# Patient Record
Sex: Female | Born: 1965 | Race: Black or African American | Hispanic: No | Marital: Married | Smoking: Never smoker
Health system: Southern US, Community
[De-identification: ages and names within clinical notes are randomized; demographics above are authoritative.]

## PROBLEM LIST (undated history)

## (undated) DIAGNOSIS — E119 Type 2 diabetes mellitus without complications: Secondary | ICD-10-CM

## (undated) DIAGNOSIS — D649 Anemia, unspecified: Secondary | ICD-10-CM

## (undated) DIAGNOSIS — E669 Obesity, unspecified: Secondary | ICD-10-CM

## (undated) DIAGNOSIS — E1169 Type 2 diabetes mellitus with other specified complication: Secondary | ICD-10-CM

## (undated) DIAGNOSIS — I10 Essential (primary) hypertension: Secondary | ICD-10-CM

## (undated) HISTORY — DX: Essential (primary) hypertension: I10

## (undated) HISTORY — DX: Obesity, unspecified: E66.9

## (undated) HISTORY — DX: Type 2 diabetes mellitus with other specified complication: E11.69

## (undated) HISTORY — PX: WISDOM TOOTH EXTRACTION: SHX21

## (undated) HISTORY — DX: Anemia, unspecified: D64.9

## (undated) HISTORY — PX: COLONOSCOPY: SHX174

---
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
## 1998-09-15 HISTORY — PX: MYOMECTOMY: SHX85

## 2019-04-06 ENCOUNTER — Encounter: Payer: Self-pay | Admitting: Obstetrics & Gynecology

## 2019-04-06 ENCOUNTER — Other Ambulatory Visit: Payer: Self-pay

## 2019-04-06 ENCOUNTER — Ambulatory Visit (INDEPENDENT_AMBULATORY_CARE_PROVIDER_SITE_OTHER): Payer: Managed Care, Other (non HMO) | Admitting: Obstetrics & Gynecology

## 2019-04-06 VITALS — BP 132/74 | HR 98 | Ht 64.0 in | Wt 245.0 lb

## 2019-04-06 DIAGNOSIS — Z124 Encounter for screening for malignant neoplasm of cervix: Secondary | ICD-10-CM | POA: Diagnosis not present

## 2019-04-06 DIAGNOSIS — Z1151 Encounter for screening for human papillomavirus (HPV): Secondary | ICD-10-CM | POA: Diagnosis not present

## 2019-04-06 DIAGNOSIS — Z01419 Encounter for gynecological examination (general) (routine) without abnormal findings: Secondary | ICD-10-CM

## 2019-04-06 NOTE — Progress Notes (Signed)
Last periods March 2019, Sept 2019. Kathrene Alu RN

## 2019-04-06 NOTE — Progress Notes (Signed)
Subjective:     Kiara Hughes is a 53 y.o. female here for a routine exam.  G0 LMP Sept, 2019. Last cycles prior to  Sept was Mar 2019.  Current complaints: none. Pt recently relocated from [place], [place]. Referred her by Dr. Frederik Schmidt.  She is working in Press photographer at [employer]. She is married and sexually active.   Last colonoscopy was 1 yer prev. Had a precancerous lesion. Was told that she needed a repeat colonoscopy in 1 year.        Gynecologic History No LMP recorded. Patient is perimenopausal. Contraception: perimenopuase.  [place] 1 year prev [place]. Results were: normal Last mammogram: 1 year prev in [place]. / Results were: normal  Obstetric History OB History  Gravida Para Term Preterm AB Living  0 0 0 0 0 0  SAB TAB Ectopic Multiple Live Births  0 0 0 0 0   The following portions of the patient's history were reviewed and updated as appropriate: allergies, current medications, past family history, past medical history, past social history, past surgical history and problem list.  Review of Systems Pertinent items are noted in HPI.    Objective:  BP 132/74   Pulse 98   Ht 5\' 4"  (1.626 m)   Wt 245 lb (111.1 kg)   BMI 42.05 kg/m   General Appearance:    Alert, cooperative, no distress, appears stated age  Head:    Normocephalic, without obvious abnormality, atraumatic  Eyes:    conjunctiva/corneas clear, EOM's intact, both eyes  Ears:    Normal external ear canals, both ears  Nose:   Nares normal, septum midline, mucosa normal, no drainage    or sinus tenderness  Throat:   Lips, mucosa, and tongue normal; teeth and gums normal  Neck:   Supple, symmetrical, trachea midline, no adenopathy;    thyroid:  no enlargement/tenderness/nodules  Back:     Symmetric, no curvature, ROM normal, no CVA tenderness  Lungs:     respirations unlabored  Chest Wall:    No tenderness or deformity   Heart:    Regular rate and rhythm  Breast Exam:    No tenderness, masses, or nipple  abnormality  Abdomen:     Soft, non-tender, bowel sounds active all four quadrants,    no masses, no organomegaly  Genitalia:    Normal female without lesion, discharge or tenderness     Extremities:   Extremities normal, atraumatic, no cyanosis or edema  Pulses:   2+ and symmetric all extremities  Skin:   Skin color, texture, turgor normal, no rashes or lesions    Assessment:    Healthy female exam.   Perimenopausal state Needs primary care provider Needs GI referral to eval need for repeat colonoscopy after precancerous lesion noted Plan:   Screening mammogram  Referral to GI Referral to primary care F/u in 1 year or sooner prn Records from [facility]. Harraway-Smith, M.D., Cherlynn June

## 2019-04-08 LAB — CYTOLOGY - PAP
Adequacy: ABSENT
Diagnosis: NEGATIVE
HPV: NOT DETECTED

## 2019-04-25 ENCOUNTER — Telehealth: Payer: Self-pay | Admitting: Gastroenterology

## 2019-04-25 NOTE — Telephone Encounter (Signed)
I have not seen these records on my desk today. Thanks.

## 2019-04-25 NOTE — Telephone Encounter (Signed)
Hi Dr. Tarri Glenn, we have received a referral from Patient's Obgyn for a repeat colon. Patient had a colon in April of last year that was abnormal per notes in the referral. We have received her previous GI records and they will be placed on your desk for review. Patient is requesting a female physician. Thank you.

## 2019-05-06 NOTE — Telephone Encounter (Signed)
Spoke to Release of Information at previous GI practice. Received a message that our physician Dr Tarri Glenn reviewed previous Colonoscopy & pathology and is now looking for surveillance recommendations. They are unsure of what exactly to send and wonders if we can call back and clarify please? Return call to [phone] choose option 1 twice.

## 2019-05-16 NOTE — Telephone Encounter (Signed)
Hi Dr. Tarri Glenn, I spoke with release of information regarding surveillance recommendations for pt, They do not have that information, they stated that pt was seen 2 more times there for office visits, they faxed those records for your review but that is all they have. Records will be placed on your desk for review when you return to the office. Thank you.

## 2019-05-17 NOTE — Telephone Encounter (Signed)
I reviewed all of the records provided. Thank you for your hard work obtaining all of the results.  It appears that 6 polyps were removed. They were both sessile serrated polyps and a hyperplastic polyp. Given these results, I recommend surveillance in 3 years from the time of her last colonoscopy. Surveillance colonoscopy due 12/2020.  Thank you.

## 2019-05-18 NOTE — Telephone Encounter (Signed)
Pt's records will be filed in Records cabinet.

## 2019-05-18 NOTE — Telephone Encounter (Signed)
Informed pt of her recall date 12/2020 and that she will receive a letter when she is due.

## 2019-05-25 ENCOUNTER — Ambulatory Visit: Payer: Managed Care, Other (non HMO) | Admitting: Family Medicine

## 2019-06-01 ENCOUNTER — Ambulatory Visit: Payer: Managed Care, Other (non HMO) | Admitting: Family Medicine

## 2019-06-02 ENCOUNTER — Other Ambulatory Visit: Payer: Self-pay

## 2019-06-03 ENCOUNTER — Encounter: Payer: Self-pay | Admitting: Family Medicine

## 2019-06-03 ENCOUNTER — Ambulatory Visit: Payer: Managed Care, Other (non HMO) | Admitting: Family Medicine

## 2019-06-03 ENCOUNTER — Other Ambulatory Visit: Payer: Self-pay

## 2019-06-03 VITALS — BP 138/76 | HR 106 | Temp 97.1°F | Ht 64.0 in | Wt 247.5 lb

## 2019-06-03 DIAGNOSIS — I10 Essential (primary) hypertension: Secondary | ICD-10-CM

## 2019-06-03 DIAGNOSIS — Z23 Encounter for immunization: Secondary | ICD-10-CM

## 2019-06-03 DIAGNOSIS — E119 Type 2 diabetes mellitus without complications: Secondary | ICD-10-CM | POA: Diagnosis not present

## 2019-06-03 LAB — LIPID PANEL
Cholesterol: 164 mg/dL (ref 0–200)
HDL: 42.2 mg/dL (ref >39.00)
LDL Cholesterol: 98 mg/dL (ref 0–99)
NonHDL: 121.96
Total CHOL/HDL Ratio: 4
Triglycerides: 119 mg/dL (ref 0.0–149.0)
VLDL: 23.8 mg/dL (ref 0.0–40.0)

## 2019-06-03 LAB — COMPREHENSIVE METABOLIC PANEL
ALT: 20 U/L (ref 0–35)
AST: 14 U/L (ref 0–37)
Albumin: 4.3 g/dL (ref 3.5–5.2)
Alkaline Phosphatase: 75 U/L (ref 39–117)
BUN: 13 mg/dL (ref 6–23)
CO2: 26 mEq/L (ref 19–32)
Calcium: 9.3 mg/dL (ref 8.4–10.5)
Chloride: 101 mEq/L (ref 96–112)
Creatinine, Ser: 0.71 mg/dL (ref 0.40–1.20)
GFR: 104.23 mL/min (ref >60.00)
Glucose, Bld: 178 mg/dL — ABNORMAL HIGH (ref 70–99)
Potassium: 4.1 mEq/L (ref 3.5–5.1)
Sodium: 136 mEq/L (ref 135–145)
Total Bilirubin: 0.2 mg/dL (ref 0.2–1.2)
Total Protein: 7.8 g/dL (ref 6.0–8.3)

## 2019-06-03 LAB — HEMOGLOBIN A1C: Hgb A1c MFr Bld: 6.9 % — ABNORMAL HIGH (ref 4.6–6.5)

## 2019-06-03 MED ORDER — AMLODIPINE BESYLATE 10 MG PO TABS: 10.0000 mg | ORAL_TABLET | Freq: Every day | ORAL | 2 refills | Status: DC

## 2019-06-03 MED ORDER — LOSARTAN POTASSIUM 100 MG PO TABS: 100.0000 mg | ORAL_TABLET | Freq: Every day | ORAL | 2 refills | Status: DC

## 2019-06-03 MED ORDER — METFORMIN HCL ER 500 MG PO TB24: 500.0000 mg | ORAL_TABLET | Freq: Two times a day (BID) | ORAL | 2 refills | Status: DC

## 2019-06-03 MED ORDER — ATORVASTATIN CALCIUM 40 MG PO TABS: 40.0000 mg | ORAL_TABLET | Freq: Every day | ORAL | 3 refills | Status: DC

## 2019-06-03 MED ORDER — SITAGLIPTIN PHOSPHATE 100 MG PO TABS: 100.0000 mg | ORAL_TABLET | Freq: Every day | ORAL | 2 refills | Status: DC

## 2019-06-03 NOTE — Progress Notes (Signed)
Chief Complaint  Patient presents with  . New Patient (Initial Visit)       New Patient Visit SUBJECTIVE: HPI: Kiara Hughes is an 53 y.o.female who is being seen for establishing care.  The patient was previously seen in [place].  Patient has a history of diabetes.  She is currently on metformin XR 500 mg twice daily and glipizide 5 mg twice daily as needed.  Her last A1c was 7.5.  She has lost 40 pounds since initially being diagnosed.  She is no longer on insulin.  She is not on a statin and has never had a pneumonia shot as an adult.  Diet is improving but still has areas to get better.  She is not doing much exercise right now.  She checks her sugars 2-3 times a day.  Morning sugars are usually low to mid 100s.  Hypertension Patient presents for hypertension follow up. She does monitor home blood pressures. She is compliant with medications-losartan 100 mg daily, amlodipine 10 mg daily. Patient has these side effects of medication: none She is sometimes adhering to a healthy diet overall. Exercise: none   Allergies  Allergen Reactions  . Penicillins Nausea And Vomiting  . Sulfa Antibiotics Rash    Past Medical History:  Diagnosis Date  . Diabetes mellitus without complication ([place])   . Hypertension    History reviewed. No pertinent surgical history.   Family History  Problem Relation Age of Onset  . Diabetes Mother   . Hypertension Mother   . Diabetes Sister   . Cancer Neg Hx   . Stroke Neg Hx    Allergies  Allergen Reactions  . Penicillins Nausea And Vomiting  . Sulfa Antibiotics Rash    Current Outpatient Medications:  .  amLODipine (NORVASC) 10 MG tablet, Take 1 tablet (10 mg total) by mouth daily., Disp: 90 tablet, Rfl: 2 .  glucose blood test strip, , Disp: , Rfl:  .  Lancets (ONETOUCH DELICA PLUS 123XX123) MISC, , Disp: , Rfl:  .  losartan (COZAAR) 100 MG tablet, Take 1 tablet (100 mg total) by mouth daily., Disp: 90 tablet, Rfl: 2 .  metFORMIN  (GLUCOPHAGE-XR) 500 MG 24 hr tablet, Take 1 tablet (500 mg total) by mouth 2 (two) times daily., Disp: 180 tablet, Rfl: 2 .  atorvastatin (LIPITOR) 40 MG tablet, Take 1 tablet (40 mg total) by mouth daily., Disp: 90 tablet, Rfl: 3 .  sitaGLIPtin (JANUVIA) 100 MG tablet, Take 1 tablet (100 mg total) by mouth daily., Disp: 90 tablet, Rfl: 2  No LMP recorded. Patient is perimenopausal.  ROS Cardiovascular: Denies chest pain  Respiratory: Denies dyspnea   OBJECTIVE: BP 138/76 (BP Location: Left Arm, Patient Position: Sitting, Cuff Size: Large)   Pulse (!) 106   Temp (!) 97.1 F (36.2 C) (Temporal)   Ht 5\' 4"  (1.626 m)   Wt 247 lb 8 oz (112.3 kg)   SpO2 96%   BMI 42.48 kg/m   Constitutional: -  VS reviewed -  Well developed, well nourished, appears stated age -  No apparent distress  Psychiatric: -  Oriented to person, place, and time -  Memory intact -  Affect and mood normal -  Fluent conversation, good eye contact -  Judgment and insight age appropriate  Eye: -  Conjunctivae clear, no discharge -  Pupils symmetric, round, reactive to light  ENMT: -  MMM    Pharynx moist, no exudate, no erythema  Neck: -  No gross swelling, no palpable  masses -  Thyroid midline, not enlarged, mobile, no palpable masses  Cardiovascular: -  RRR -  No LE edema  Respiratory: -  Normal respiratory effort, no accessory muscle use, no retraction -  Breath sounds equal, no wheezes, no ronchi, no crackles  Musculoskeletal: -  No clubbing, no cyanosis -  Gait normal  Skin: -  No significant lesion on inspection -  Warm and dry to palpation   ASSESSMENT/PLAN: Type 2 diabetes mellitus without complication, without long-term current use of insulin (HCC) - Plan: metFORMIN (GLUCOPHAGE-XR) 500 MG 24 hr tablet, sitaGLIPtin (JANUVIA) 100 MG tablet, Comprehensive metabolic panel, Lipid panel, Hemoglobin A1c, atorvastatin (LIPITOR) 40 MG tablet  Essential hypertension - Plan: losartan (COZAAR) 100 MG tablet,  amLODipine (NORVASC) 10 MG tablet  Need for vaccination against Streptococcus pneumoniae - Plan: Pneumococcal polysaccharide vaccine 23-valent greater than or equal to 2yo subcutaneous/IM   1-add statin, stop glipizide given hypoglycemia, start Januvia.  We did discuss GLP-1's and SGLT2 inhibitors.  She prefers Januvia.  Counseled on diet and exercise. 2-continue medications.  Check as needed. Patient instructed to sign release of records form from her previous PCP. Patient should return in 3-6 mo pending her A1c. The patient voiced understanding and agreement to the plan.   [place], DO 06/03/19  8:29 AM

## 2019-06-03 NOTE — Patient Instructions (Addendum)
I recommend getting the flu shot in mid October. This suggestion would change if the CDC comes out with a different recommendation.   Give [place] 2-3 business days to get the results of your labs back.   Keep the diet clean and stay active.  Aim to do some physical exertion for 150 minutes per week. This is typically divided into 5 days per week, 30 minutes per day. The activity should be enough to get your heart rate up. Anything is better than nothing if you have time constraints.  Keep the diet clean and stay active.

## 2019-06-06 ENCOUNTER — Encounter: Payer: Self-pay | Admitting: Family Medicine

## 2019-06-07 ENCOUNTER — Other Ambulatory Visit: Payer: Self-pay | Admitting: Family Medicine

## 2019-06-07 MED ORDER — METFORMIN HCL 1000 MG PO TABS: 1000.0000 mg | ORAL_TABLET | Freq: Two times a day (BID) | ORAL | 1 refills | Status: DC

## 2019-06-20 ENCOUNTER — Other Ambulatory Visit: Payer: Self-pay | Admitting: Family Medicine

## 2019-06-20 MED ORDER — ONETOUCH DELICA PLUS LANCET33G MISC: 1.0000 | Freq: Every day | 3 refills | Status: DC

## 2019-06-20 MED ORDER — GLUCOSE BLOOD VI STRP: ORAL_STRIP | 3 refills | Status: DC

## 2019-06-27 ENCOUNTER — Encounter: Payer: Self-pay | Admitting: Family Medicine

## 2019-06-28 ENCOUNTER — Other Ambulatory Visit: Payer: Self-pay | Admitting: Family Medicine

## 2019-06-28 MED ORDER — ALCOHOL PADS 70 % PADS: MEDICATED_PAD | 1 refills | Status: DC

## 2019-07-12 ENCOUNTER — Encounter: Payer: Self-pay | Admitting: Family Medicine

## 2019-07-18 ENCOUNTER — Encounter: Payer: Self-pay | Admitting: Family Medicine

## 2019-07-21 ENCOUNTER — Encounter: Payer: Self-pay | Admitting: Hematology & Oncology

## 2019-07-21 LAB — HM DIABETES EYE EXAM

## 2019-07-26 ENCOUNTER — Encounter: Payer: Self-pay | Admitting: Family Medicine

## 2019-07-27 ENCOUNTER — Other Ambulatory Visit: Payer: Self-pay | Admitting: Family Medicine

## 2019-07-27 ENCOUNTER — Encounter: Payer: Self-pay | Admitting: Family Medicine

## 2019-07-27 MED ORDER — MICROLET LANCETS MISC: 1.0000 | Freq: Every day | 1 refills | Status: DC

## 2019-07-27 MED ORDER — BAYER MICROLET LANCETS MISC: 1 refills | Status: DC

## 2019-07-27 MED ORDER — CONTOUR NEXT MONITOR W/DEVICE KIT: 1.0000 | PACK | Freq: Every day | 0 refills | Status: DC

## 2019-07-27 MED ORDER — CONTOUR NEXT TEST VI STRP: ORAL_STRIP | 1 refills | Status: DC

## 2019-08-25 ENCOUNTER — Encounter: Payer: Self-pay | Admitting: Family Medicine

## 2019-08-29 ENCOUNTER — Encounter: Payer: Self-pay | Admitting: Family Medicine

## 2019-08-29 ENCOUNTER — Other Ambulatory Visit: Payer: Self-pay | Admitting: Family Medicine

## 2019-08-31 ENCOUNTER — Ambulatory Visit (HOSPITAL_BASED_OUTPATIENT_CLINIC_OR_DEPARTMENT_OTHER): Payer: Managed Care, Other (non HMO)

## 2019-09-01 ENCOUNTER — Ambulatory Visit (HOSPITAL_BASED_OUTPATIENT_CLINIC_OR_DEPARTMENT_OTHER)
Admission: RE | Admit: 2019-09-01 | Discharge: 2019-09-01 | Disposition: A | Discharge status: Home / Self Care | Payer: Managed Care, Other (non HMO) | Source: Ambulatory Visit | Attending: Obstetrics & Gynecology | Admitting: Obstetrics & Gynecology

## 2019-09-01 ENCOUNTER — Other Ambulatory Visit: Payer: Self-pay

## 2019-09-01 DIAGNOSIS — Z1231 Encounter for screening mammogram for malignant neoplasm of breast: Secondary | ICD-10-CM | POA: Diagnosis present

## 2019-09-01 DIAGNOSIS — Z01419 Encounter for gynecological examination (general) (routine) without abnormal findings: Secondary | ICD-10-CM

## 2019-09-05 ENCOUNTER — Ambulatory Visit (HOSPITAL_BASED_OUTPATIENT_CLINIC_OR_DEPARTMENT_OTHER): Payer: Managed Care, Other (non HMO)

## 2019-09-07 ENCOUNTER — Other Ambulatory Visit: Payer: Self-pay | Admitting: Obstetrics & Gynecology

## 2019-09-07 DIAGNOSIS — R928 Other abnormal and inconclusive findings on diagnostic imaging of breast: Secondary | ICD-10-CM

## 2019-09-27 ENCOUNTER — Ambulatory Visit
Admission: RE | Admit: 2019-09-27 | Discharge: 2019-09-27 | Disposition: A | Discharge status: Home / Self Care | Payer: Managed Care, Other (non HMO) | Source: Ambulatory Visit | Attending: Obstetrics & Gynecology | Admitting: Obstetrics & Gynecology

## 2019-09-27 ENCOUNTER — Ambulatory Visit: Payer: Managed Care, Other (non HMO)

## 2019-09-27 ENCOUNTER — Other Ambulatory Visit: Payer: Self-pay

## 2019-09-27 DIAGNOSIS — R928 Other abnormal and inconclusive findings on diagnostic imaging of breast: Secondary | ICD-10-CM

## 2019-10-01 ENCOUNTER — Encounter: Payer: Self-pay | Admitting: Family Medicine

## 2019-10-05 ENCOUNTER — Other Ambulatory Visit: Payer: Self-pay | Admitting: Family Medicine

## 2019-11-16 ENCOUNTER — Encounter: Payer: Self-pay | Admitting: Family Medicine

## 2019-11-17 ENCOUNTER — Ambulatory Visit: Payer: Managed Care, Other (non HMO) | Attending: Internal Medicine

## 2019-11-17 DIAGNOSIS — Z23 Encounter for immunization: Secondary | ICD-10-CM | POA: Insufficient documentation

## 2019-11-17 NOTE — Progress Notes (Signed)
   Covid-19 Vaccination Clinic  Name:  Kiara Hughes    MRN: [id number] DOB: August 06, 1966  11/17/2019  Kiara Hughes was observed post Covid-19 immunization for 15 minutes without incident. She was provided with Vaccine Information Sheet and instruction to access the V-Safe system.   Kiara Hughes was instructed to call 911 with any severe reactions post vaccine: Marland Kitchen Difficulty breathing  . Swelling of face and throat  . A fast heartbeat  . A bad rash all over body  . Dizziness and weakness   Immunizations Administered    Name Date Dose VIS Date Route   Moderna COVID-19 Vaccine 11/17/2019 11:27 AM 0.5 mL 08/16/2019 Intramuscular   Manufacturer: Moderna   Lot: [id number]   [id number]

## 2019-11-18 ENCOUNTER — Encounter: Payer: Self-pay | Admitting: Family Medicine

## 2019-11-29 ENCOUNTER — Other Ambulatory Visit: Payer: Self-pay | Admitting: Family Medicine

## 2019-11-29 DIAGNOSIS — E119 Type 2 diabetes mellitus without complications: Secondary | ICD-10-CM

## 2019-11-30 ENCOUNTER — Other Ambulatory Visit: Payer: Self-pay

## 2019-12-02 ENCOUNTER — Ambulatory Visit: Payer: Managed Care, Other (non HMO) | Admitting: Family Medicine

## 2019-12-02 ENCOUNTER — Other Ambulatory Visit: Payer: Self-pay

## 2019-12-02 ENCOUNTER — Encounter: Payer: Self-pay | Admitting: Family Medicine

## 2019-12-02 VITALS — BP 122/82 | HR 103 | Temp 96.5°F | Ht 64.0 in | Wt 244.2 lb

## 2019-12-02 DIAGNOSIS — I10 Essential (primary) hypertension: Secondary | ICD-10-CM

## 2019-12-02 DIAGNOSIS — E1169 Type 2 diabetes mellitus with other specified complication: Secondary | ICD-10-CM | POA: Diagnosis not present

## 2019-12-02 DIAGNOSIS — E669 Obesity, unspecified: Secondary | ICD-10-CM

## 2019-12-02 DIAGNOSIS — E1165 Type 2 diabetes mellitus with hyperglycemia: Secondary | ICD-10-CM | POA: Insufficient documentation

## 2019-12-02 DIAGNOSIS — E119 Type 2 diabetes mellitus without complications: Secondary | ICD-10-CM | POA: Insufficient documentation

## 2019-12-02 LAB — MICROALBUMIN / CREATININE URINE RATIO
Creatinine,U: 150.2 mg/dL
Microalb Creat Ratio: 1.4 mg/g (ref 0.0–30.0)
Microalb, Ur: 2.2 mg/dL — ABNORMAL HIGH (ref 0.0–1.9)

## 2019-12-02 LAB — HEMOGLOBIN A1C: Hgb A1c MFr Bld: 6.8 % — ABNORMAL HIGH (ref 4.6–6.5)

## 2019-12-02 NOTE — Patient Instructions (Signed)
Give us 2-3 business days to get the results of your labs back.   Keep the diet clean and stay active.  Check your sugars around 2-3 times per week. Alternate checking in the morning before you eat, in the afternoon and before bed. Write them down and bring it to your next appointment.   Let us know if you need anything. 

## 2019-12-02 NOTE — Progress Notes (Signed)
Subjective:   Chief Complaint  Patient presents with  . Follow-up    Kiara Hughes is a 54 y.o. female here for follow-up of diabetes.   Kiara Hughes's self monitored glucose range is 130-150's Patient denies hypoglycemic reactions. She checks her glucose levels 2 times per day. Patient does not require insulin.   Medications include: metformin 1000 mg bid, Januvia 100 mg/d Exercise: walking  Hypertension Patient presents for hypertension follow up. She does not routinely monitor home blood pressures. She is compliant with medications - losartan 100 mg/d, Norvasc 10 mg/d Patient has these side effects of medication: none Diet/exercise as above  Past Medical History:  Diagnosis Date  . Diabetes mellitus without complication ([place])   . Hypertension      Related testing: Date of retinal exam: Done Pneumovax: done Flu Shot: done  Review of Systems: Pulmonary:  No SOB Cardiovascular:  No chest pain  Objective:  BP 122/82 (BP Location: Left Arm, Patient Position: Sitting, Cuff Size: Large)   Pulse (!) 103   Temp (!) 96.5 F (35.8 C) (Temporal)   Ht 5\' 4"  (1.626 m)   Wt 244 lb 4 oz (110.8 kg)   SpO2 98%   BMI 41.93 kg/m  General:  Well developed, well nourished, in no apparent distress Skin:  Warm, no pallor or diaphoresis Head:  Normocephalic, atraumatic Eyes:  Pupils equal and round, sclera anicteric without injection  Lungs:  CTAB, no access msc use Cardio:  RRR, no bruits, no LE edema Musculoskeletal:  Symmetrical muscle groups noted without atrophy or deformity Neuro:  Sensation intact to pinprick on feet Psych: Age appropriate judgment and insight  Assessment:   Diabetes mellitus type 2 in obese (HCC) - Plan: Hemoglobin A1c, Microalbumin / creatinine urine ratio  Essential hypertension   Plan:   Orders as above. Counseled on diet and exercise. Cont current meds.  F/u in 6 mo pending above. The patient voiced understanding and agreement to the  plan.  [place], DO 12/02/19 9:51 AM

## 2019-12-07 ENCOUNTER — Encounter: Payer: Self-pay | Admitting: Family Medicine

## 2019-12-21 ENCOUNTER — Ambulatory Visit: Payer: Managed Care, Other (non HMO) | Attending: Internal Medicine

## 2019-12-21 DIAGNOSIS — Z23 Encounter for immunization: Secondary | ICD-10-CM

## 2019-12-21 NOTE — Progress Notes (Signed)
   Covid-19 Vaccination Clinic  Name:  Kiara Hughes    MRN: [id number] DOB: 1966/04/24  12/21/2019  [name] was observed post Covid-19 immunization for 15 minutes without incident. She was provided with Vaccine Information Sheet and instruction to access the V-Safe system.   [name] was instructed to call 911 with any severe reactions post vaccine: Marland Kitchen Difficulty breathing  . Swelling of face and throat  . A fast heartbeat  . A bad rash all over body  . Dizziness and weakness   Immunizations Administered    Name Date Dose VIS Date Route   Moderna COVID-19 Vaccine 12/21/2019 10:49 AM 0.5 mL 08/16/2019 Intramuscular   Manufacturer: Levan Hurst   [id number]   [id number]

## 2020-01-06 ENCOUNTER — Other Ambulatory Visit: Payer: Self-pay | Admitting: Family Medicine

## 2020-01-06 DIAGNOSIS — I10 Essential (primary) hypertension: Secondary | ICD-10-CM

## 2020-01-11 IMAGING — MG DIGITAL SCREENING BILAT W/ TOMO W/ CAD
8 series · 8 of 24 positions shown · non-contrast
Comparison: Previous exam(s).

CLINICAL DATA: Screening.

EXAM:
DIGITAL SCREENING BILATERAL MAMMOGRAM WITH TOMO AND CAD

[L CC synth-2D]
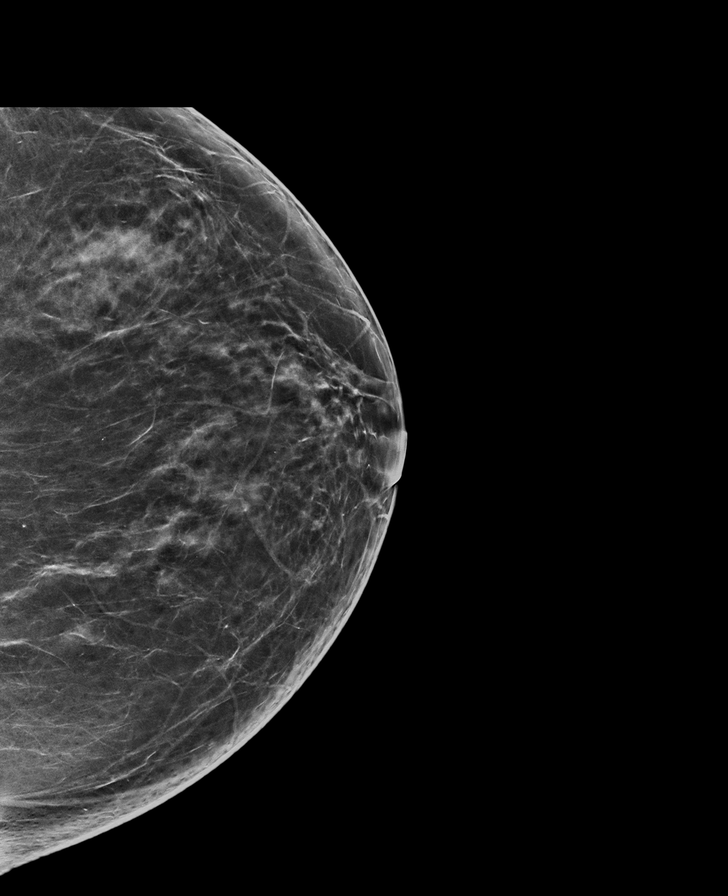

[R CC synth-2D]
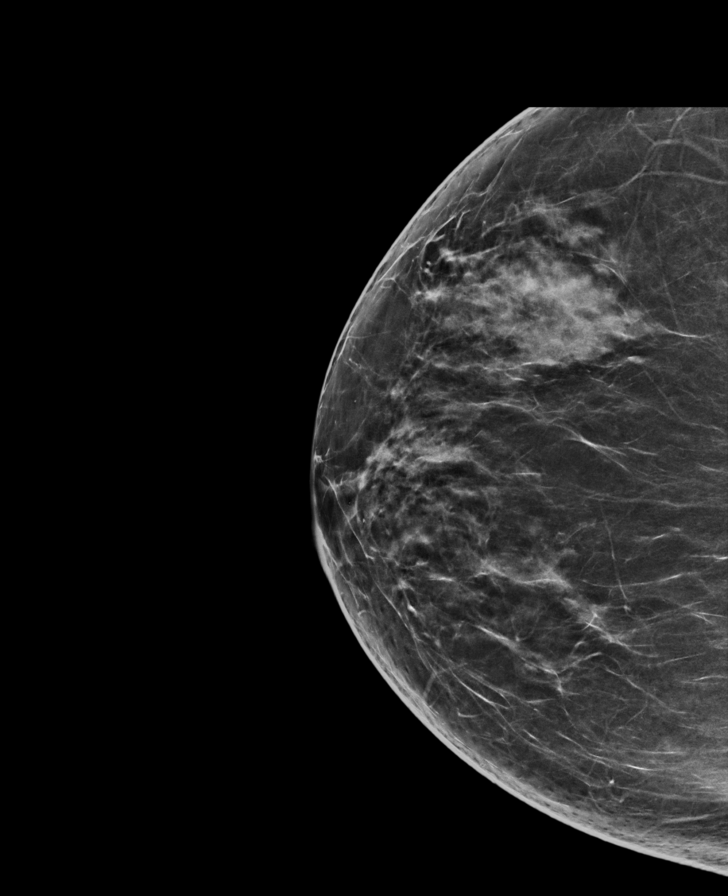

[L MLO synth-2D]
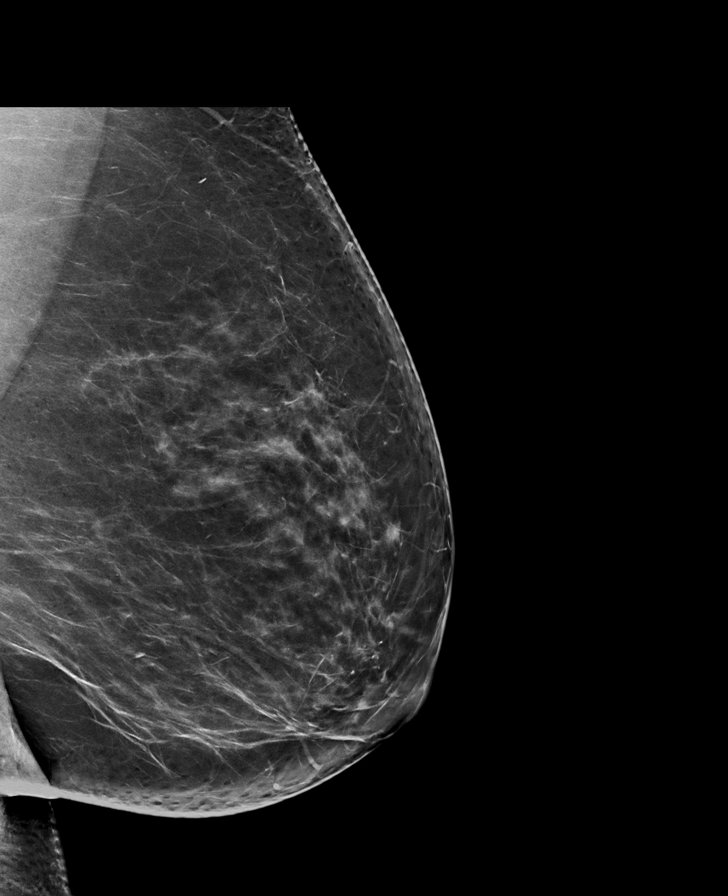

[R MLO synth-2D]
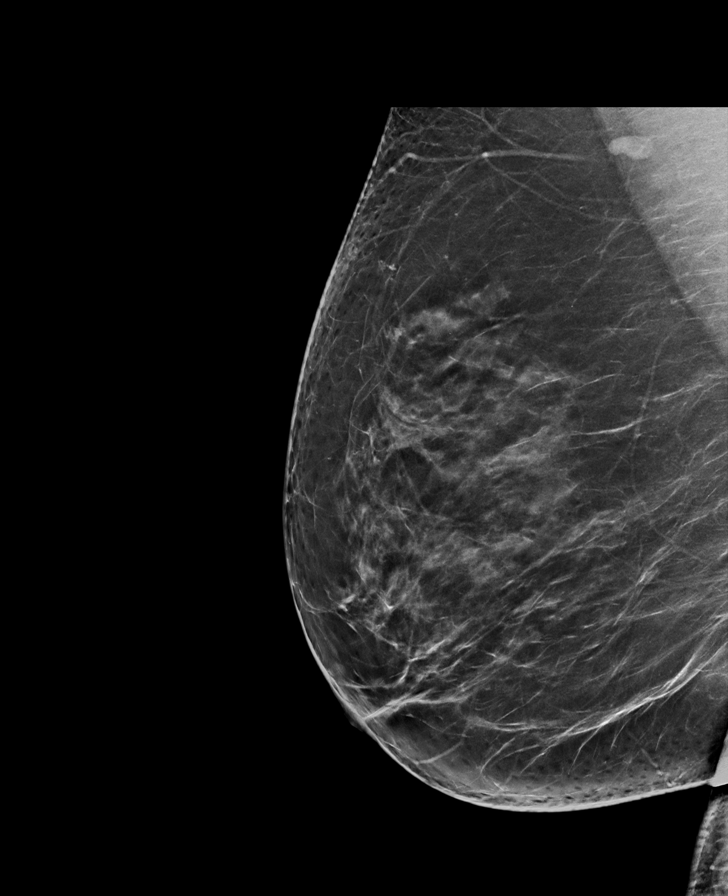

[R MLO tomo · tomo slice 43/86.0]
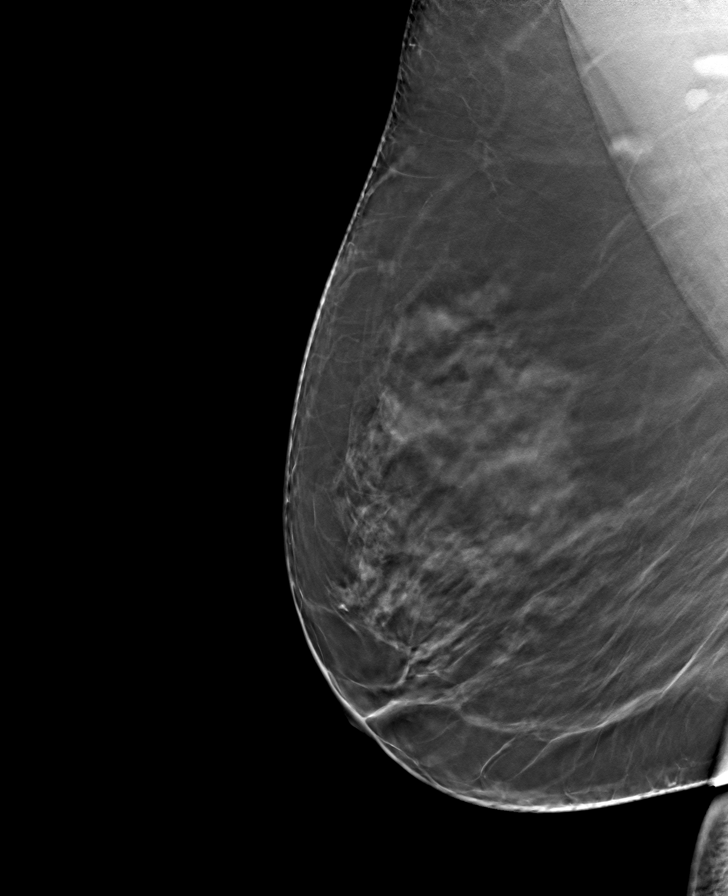

[L MLO tomo · tomo slice 45/90.0]
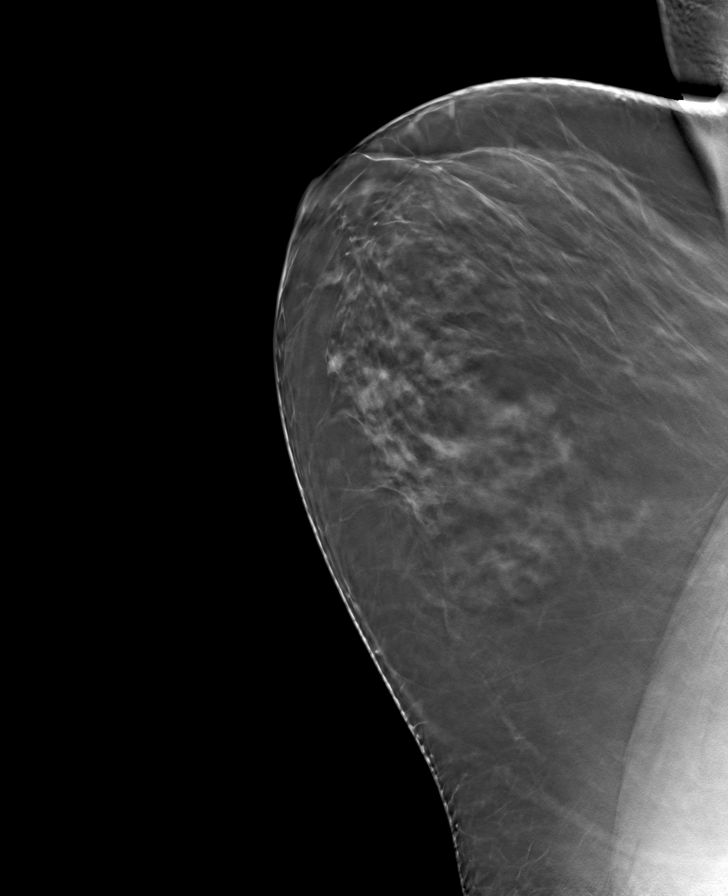

[R CC tomo · tomo slice 37/74.0]
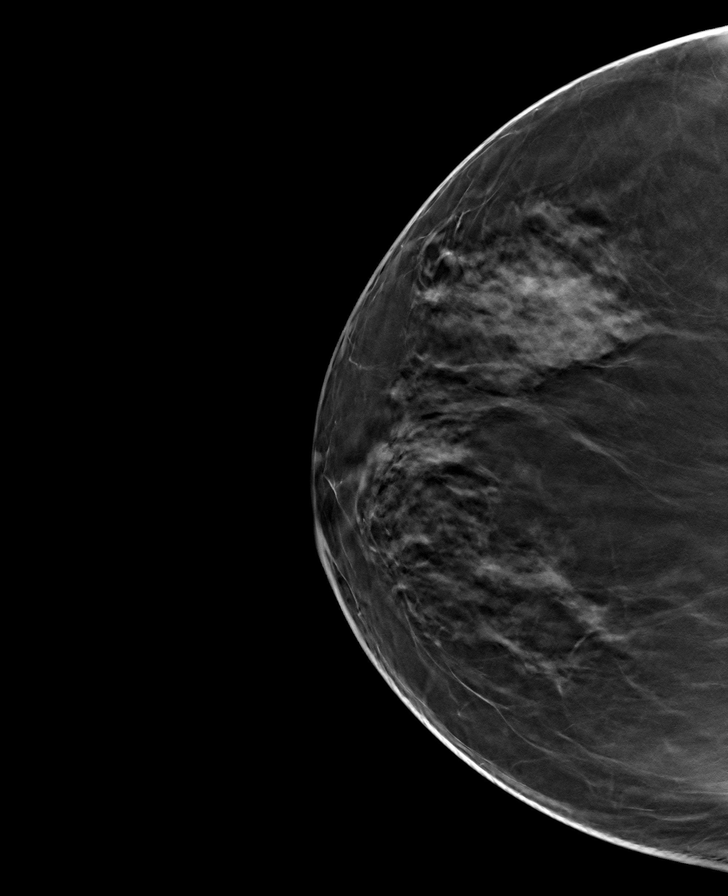

[L CC tomo · tomo slice 39/77.0]
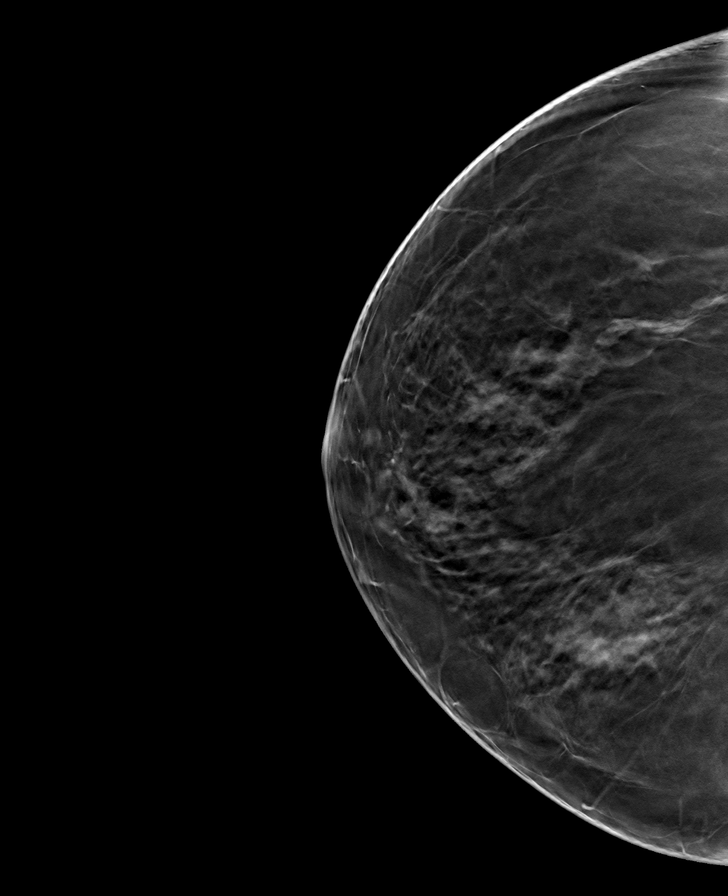

[8 of 24 positions shown; findings below may reference images not displayed]

ACR Breast Density Category b: There are scattered areas of
fibroglandular density.
FINDINGS: In the left breast, a possible asymmetry warrants further
evaluation. In the right breast, no findings suspicious for
malignancy. Images were processed with CAD.
IMPRESSION: Further evaluation is suggested for possible asymmetry in the left
breast.

RECOMMENDATION:
Diagnostic mammogram and possibly ultrasound of the left breast.
(Code:DI-5-LL4)

The patient will be contacted regarding the findings, and additional
imaging will be scheduled.

BI-RADS CATEGORY  0: Incomplete. Need additional imaging evaluation
and/or prior mammograms for comparison.

## 2020-02-13 ENCOUNTER — Other Ambulatory Visit: Payer: Self-pay | Admitting: Family Medicine

## 2020-02-13 DIAGNOSIS — E119 Type 2 diabetes mellitus without complications: Secondary | ICD-10-CM

## 2020-03-30 ENCOUNTER — Encounter: Payer: Self-pay | Admitting: Obstetrics & Gynecology

## 2020-03-30 ENCOUNTER — Other Ambulatory Visit: Payer: Self-pay

## 2020-03-30 ENCOUNTER — Ambulatory Visit (HOSPITAL_BASED_OUTPATIENT_CLINIC_OR_DEPARTMENT_OTHER): Payer: Managed Care, Other (non HMO) | Admitting: Obstetrics & Gynecology

## 2020-03-30 VITALS — BP 135/78 | HR 95 | Ht 64.0 in | Wt 241.0 lb

## 2020-03-30 DIAGNOSIS — D219 Benign neoplasm of connective and other soft tissue, unspecified: Secondary | ICD-10-CM

## 2020-03-30 DIAGNOSIS — Z01419 Encounter for gynecological examination (general) (routine) without abnormal findings: Secondary | ICD-10-CM

## 2020-03-30 DIAGNOSIS — Z1211 Encounter for screening for malignant neoplasm of colon: Secondary | ICD-10-CM

## 2020-03-30 DIAGNOSIS — D259 Leiomyoma of uterus, unspecified: Secondary | ICD-10-CM | POA: Diagnosis not present

## 2020-03-30 NOTE — Progress Notes (Signed)
Subjective:     Kiara Hughes is a 54 y.o. female here for a routine exam. G0  Current complaints: none. Pts mother died in 10-14-19.      Gynecologic History No LMP recorded. Patient is perimenopausal. Contraception: post menopausal status Last Pap: 2020. Results were: normal Last mammogram: October 14, 2019 Results were: normal  Obstetric History OB History  Gravida Para Term Preterm AB Living  0 0 0 0 0 0  SAB TAB Ectopic Multiple Live Births  0 0 0 0 0   The following portions of the patient's history were reviewed and updated as appropriate: allergies, current medications, past family history, past medical history, past social history, past surgical history and problem list.  Review of Systems Pertinent items are noted in HPI.    Objective:   BP 135/78   Pulse 95   Ht 5\' 4"  (1.626 m)   Wt 241 lb (109.3 kg)   BMI 41.37 kg/m  General Appearance:    Alert, cooperative, no distress, appears stated age  Head:    Normocephalic, without obvious abnormality, atraumatic  Eyes:    conjunctiva/corneas clear, EOM's intact, both eyes  Ears:    Normal external ear canals, both ears  Nose:   Nares normal, septum midline, mucosa normal, no drainage    or sinus tenderness  Throat:   Lips, mucosa, and tongue normal; teeth and gums normal  Neck:   Supple, symmetrical, trachea midline, no adenopathy;    thyroid:  no enlargement/tenderness/nodules  Back:     Symmetric, no curvature, ROM normal, no CVA tenderness  Lungs:     respirations unlabored  Chest Wall:    No tenderness or deformity   Heart:    Regular rate and rhythm  Breast Exam:    No tenderness, masses, or nipple abnormality  Abdomen:     Soft, non-tender, bowel sounds active all four quadrants,    no masses, no organomegaly; well healed vertical incision.    Genitalia:    Normal female without lesion, discharge or tenderness; enlarged uterus. Stable due to fibroids.      Extremities:   Extremities normal, atraumatic, no cyanosis or  edema  Pulses:   2+ and symmetric all extremities  Skin:   Skin color, texture, turgor normal, no rashes or lesions    Assessment:    Healthy female exam.   Uterine fibroids- stable. Asymptomatic Colon cancer screening- pt told in [place] that she needed a f/u colonoscopy in 1 year. That was in 2020. She was told here that she needed it in 2022. But, she is concerned about the findings from the GI in [place].        Plan:  F/u in 1 year Mammogram in 10/13/2022 Referral to GI for eval for colonoscopy .  Mariesa Grieder L. Harraway-Smith, M.D., Cherlynn June

## 2020-06-15 ENCOUNTER — Encounter: Payer: Self-pay | Admitting: Family Medicine

## 2020-06-15 ENCOUNTER — Other Ambulatory Visit: Payer: Self-pay

## 2020-06-15 ENCOUNTER — Ambulatory Visit (INDEPENDENT_AMBULATORY_CARE_PROVIDER_SITE_OTHER): Payer: Managed Care, Other (non HMO) | Admitting: Family Medicine

## 2020-06-15 VITALS — BP 128/82 | HR 72 | Temp 98.9°F | Ht 64.0 in | Wt 227.5 lb

## 2020-06-15 DIAGNOSIS — Z1159 Encounter for screening for other viral diseases: Secondary | ICD-10-CM

## 2020-06-15 DIAGNOSIS — E1169 Type 2 diabetes mellitus with other specified complication: Secondary | ICD-10-CM | POA: Diagnosis not present

## 2020-06-15 DIAGNOSIS — Z0000 Encounter for general adult medical examination without abnormal findings: Secondary | ICD-10-CM

## 2020-06-15 DIAGNOSIS — Z23 Encounter for immunization: Secondary | ICD-10-CM

## 2020-06-15 DIAGNOSIS — I10 Essential (primary) hypertension: Secondary | ICD-10-CM | POA: Diagnosis not present

## 2020-06-15 DIAGNOSIS — E669 Obesity, unspecified: Secondary | ICD-10-CM

## 2020-06-15 NOTE — Addendum Note (Signed)
Addended by: Sharon Seller B on: 06/15/2020 08:02 AM   Modules accepted: Orders

## 2020-06-15 NOTE — Progress Notes (Signed)
Chief Complaint  Patient presents with   Annual Exam     Well Woman Kiara Hughes is here for a complete physical.   Her last physical was >1 year ago.  Current diet: in general, a "healthy" diet. Current exercise: walking. Weight is intentionally decreased and she denies fatigue out of ordinary.  Seatbelt? Yes  Health Maintenance Pap/HPV- Yes Mammogram- Yes Colon cancer screening-Yes- due for follow up in 12/2020 Shingrix- No Tetanus- Yes Hep C screening- No HIV screening- Yes  Past Medical History:  Diagnosis Date   Diabetes mellitus type 2 in obese ([place])    Hypertension      Past Surgical History:  Procedure Laterality Date   MYOMECTOMY  2000   WISDOM TOOTH EXTRACTION      Medications  Current Outpatient Medications on File Prior to Visit  Medication Sig Dispense Refill   Alcohol Swabs (ALCOHOL PADS) 70 % PADS Use daily to check blood sugar 300 each 1   amLODipine (NORVASC) 10 MG tablet TAKE 1 TABLET BY MOUTH  DAILY 90 tablet 3   atorvastatin (LIPITOR) 40 MG tablet TAKE 1 TABLET BY MOUTH  DAILY 90 tablet 3   Blood Glucose Monitoring Suppl (CONTOUR NEXT MONITOR) w/Device KIT USE AS DIRECTED DAILY 1 kit 0   glucose blood (CONTOUR NEXT TEST) test strip USE DAILY TO CHECK BLOOD  SUGAR 200 strip 3   JANUVIA 100 MG tablet TAKE 1 TABLET BY MOUTH  DAILY 90 tablet 3   losartan (COZAAR) 100 MG tablet TAKE 1 TABLET BY MOUTH  DAILY 90 tablet 3   metFORMIN (GLUCOPHAGE) 1000 MG tablet TAKE 1 TABLET BY MOUTH  TWICE DAILY WITH A MEAL 180 tablet 3   Microlet Lancets MISC USE 1 LANCET DAILY TO CHECK BLOOD SUGAR 100 each 3   Allergies Allergies  Allergen Reactions   Penicillins Nausea And Vomiting   Sulfa Antibiotics Rash    Review of Systems: Constitutional:  no unexpected weight changes Eye:  no recent significant change in vision Ear/Nose/Mouth/Throat:  Ears:  no recent change in hearing Nose/Mouth/Throat:  no complaints of nasal congestion, no sore  throat Cardiovascular: no chest pain Respiratory:  no shortness of breath Gastrointestinal:  no abdominal pain, no change in bowel habits GU:  Female: negative for dysuria or pelvic pain Musculoskeletal/Extremities:  no pain of the joints Integumentary (Skin/Breast):  no abnormal skin lesions reported Neurologic:  no headaches Endocrine:  denies fatigue  Exam BP 128/82 (BP Location: Left Arm, Patient Position: Sitting, Cuff Size: Normal)    Pulse 72    Temp 98.9 F (37.2 C) (Oral)    Ht '5\' 4"'  (1.626 m)    Wt 227 lb 8 oz (103.2 kg)    SpO2 96%    BMI 39.05 kg/m  General:  well developed, well nourished, in no apparent distress Skin:  no significant moles, warts, or growths Head:  no masses, lesions, or tenderness Eyes:  pupils equal and round, sclera anicteric without injection Ears:  canals without lesions, TMs shiny without retraction, no obvious effusion, no erythema Nose:  nares patent, septum midline, mucosa normal, and no drainage or sinus tenderness Throat/Pharynx:  lips and gingiva without lesion; tongue and uvula midline; non-inflamed pharynx; no exudates or postnasal drainage Neck: neck supple without adenopathy, thyromegaly, or masses Lungs:  clear to auscultation, breath sounds equal bilaterally, no respiratory distress Cardio:  regular rate and rhythm, no LE edema Abdomen:  abdomen soft, nontender; bowel sounds normal; no masses or organomegaly Genital: Defer to GYN  Musculoskeletal:  symmetrical muscle groups noted without atrophy or deformity Extremities:  no clubbing, cyanosis, or edema, no deformities, no skin discoloration Neuro:  gait normal; deep tendon reflexes normal and symmetric Psych: well oriented with normal range of affect and appropriate judgment/insight  Assessment and Plan  Well adult exam  Diabetes mellitus type 2 in obese (Kiara Hughes) - Plan: Hemoglobin A1c, Lipid panel, Comprehensive metabolic panel, CBC  Essential hypertension  Morbid obesity  (Kiara Hughes)  Encounter for hepatitis C screening test for low risk patient - Plan: Hepatitis C antibody   Well 54 y.o. female. Counseled on diet and exercise. She is doing an excellent job losing weight.  Other orders as above. Info about Shingrix given. Follow up in 6 mo or prn. The patient voiced understanding and agreement to the plan.  [place], DO 06/15/20 7:58 AM

## 2020-06-15 NOTE — Patient Instructions (Addendum)
Give [place] 2-3 business days to get the results of your labs back.   Keep the diet clean and stay active.  The new Shingrix vaccine (for shingles) is a 2 shot series. It can make people feel low energy, achy and almost like they have the flu for 48 hours after injection. Please plan accordingly when deciding on when to get this shot. Call our office for a nurse visit appointment to get this. The second shot of the series is less severe regarding the side effects, but it still lasts 48 hours.   OK to use Debrox (peroxide) in the ear to loosen up wax. Also recommend using a bulb syringe (for removing boogers from baby's noses) to flush through warm water. Do not use Q-tips as this can impact wax further.  Let us know if you need anything.

## 2020-06-16 ENCOUNTER — Other Ambulatory Visit: Payer: Self-pay | Admitting: Family Medicine

## 2020-06-16 MED ORDER — PIOGLITAZONE HCL 30 MG PO TABS: 30.0000 mg | ORAL_TABLET | Freq: Every day | ORAL | 3 refills | Status: DC

## 2020-06-18 ENCOUNTER — Other Ambulatory Visit: Payer: Self-pay | Admitting: Family Medicine

## 2020-06-18 LAB — LIPID PANEL
Cholesterol: 121 mg/dL (ref <200)
HDL: 46 mg/dL — ABNORMAL LOW (ref >=50)
LDL Cholesterol (Calc): 58 mg/dL (calc)
Non-HDL Cholesterol (Calc): 75 mg/dL (calc) (ref <130)
Total CHOL/HDL Ratio: 2.6 (calc) (ref <5.0)
Triglycerides: 83 mg/dL (ref <150)

## 2020-06-18 LAB — HEPATITIS C ANTIBODY
Hepatitis C Ab: NONREACTIVE
SIGNAL TO CUT-OFF: 0.01 (ref <1.00)

## 2020-06-18 LAB — CBC
HCT: 39.8 % (ref 35.0–45.0)
Hemoglobin: 12.6 g/dL (ref 11.7–15.5)
MCH: 27.9 pg (ref 27.0–33.0)
MCHC: 31.7 g/dL — ABNORMAL LOW (ref 32.0–36.0)
MCV: 88.2 fL (ref 80.0–100.0)
MPV: 10.5 fL (ref 7.5–12.5)
Platelets: 343 10*3/uL (ref 140–400)
RBC: 4.51 10*6/uL (ref 3.80–5.10)
RDW: 13.5 % (ref 11.0–15.0)
WBC: 6.2 10*3/uL (ref 3.8–10.8)

## 2020-06-18 LAB — COMPREHENSIVE METABOLIC PANEL
AG Ratio: 1.5 (calc) (ref 1.0–2.5)
ALT: 23 U/L (ref 6–29)
AST: 14 U/L (ref 10–35)
Albumin: 4.5 g/dL (ref 3.6–5.1)
Alkaline phosphatase (APISO): 97 U/L (ref 37–153)
BUN: 10 mg/dL (ref 7–25)
CO2: 28 mmol/L (ref 20–32)
Calcium: 9.9 mg/dL (ref 8.6–10.4)
Chloride: 98 mmol/L (ref 98–110)
Creat: 0.72 mg/dL (ref 0.50–1.05)
Globulin: 3 g/dL (calc) (ref 1.9–3.7)
Glucose, Bld: 359 mg/dL — ABNORMAL HIGH (ref 65–99)
Potassium: 4.6 mmol/L (ref 3.5–5.3)
Sodium: 134 mmol/L — ABNORMAL LOW (ref 135–146)
Total Bilirubin: 0.4 mg/dL (ref 0.2–1.2)
Total Protein: 7.5 g/dL (ref 6.1–8.1)

## 2020-06-18 LAB — HEMOGLOBIN A1C
Hgb A1c MFr Bld: 13.4 % of total Hgb — ABNORMAL HIGH (ref <5.7)
Mean Plasma Glucose: 338 (calc)
eAG (mmol/L): 18.7 (calc)

## 2020-06-18 MED ORDER — PIOGLITAZONE HCL 30 MG PO TABS: 30.0000 mg | ORAL_TABLET | Freq: Every day | ORAL | 3 refills | Status: DC

## 2020-07-10 ENCOUNTER — Other Ambulatory Visit: Payer: Self-pay | Admitting: Family Medicine

## 2020-07-13 ENCOUNTER — Ambulatory Visit: Payer: Managed Care, Other (non HMO)

## 2020-07-13 ENCOUNTER — Encounter: Payer: Self-pay | Admitting: Family Medicine

## 2020-07-13 ENCOUNTER — Other Ambulatory Visit: Payer: Self-pay

## 2020-07-13 ENCOUNTER — Ambulatory Visit: Payer: Managed Care, Other (non HMO) | Admitting: Family Medicine

## 2020-07-13 VITALS — BP 108/76 | HR 95 | Temp 98.5°F | Ht 64.0 in | Wt 226.0 lb

## 2020-07-13 DIAGNOSIS — E1165 Type 2 diabetes mellitus with hyperglycemia: Secondary | ICD-10-CM | POA: Diagnosis not present

## 2020-07-13 DIAGNOSIS — Z23 Encounter for immunization: Secondary | ICD-10-CM

## 2020-07-13 NOTE — Patient Instructions (Signed)
Send me a message in 2-3 weeks with your sugar readings.   Check your sugars around 4-5 times per week. Alternate checking in the morning before you eat, in the afternoon and before bed. Write them down and bring it to your next appointment.   Keep the diet clean and stay active.  Let us know if you need anything.

## 2020-07-13 NOTE — Addendum Note (Signed)
Addended by: Sharon Seller B on: 07/13/2020 08:06 AM   Modules accepted: Orders

## 2020-07-13 NOTE — Progress Notes (Signed)
Chief Complaint  Patient presents with  . Follow-up    Subjective: Patient is a 54 y.o. female here for f/u A1c.  A1c 13.4 at CPE on 10/1. She has been doing cardio, HIIT, wt resistance and diet has been healthy. She had been losing weight. No fatigue. She was intentionally pushing fluids and subsequently urinating more, but nothing out of the ordinary. She does not want insulin or an injection of any sort at this time. Actos 30 mg/d was added to metformin 1000 mg bid and Januvia 100 mg/d. Sugars have improved to low 200's.    Past Medical History:  Diagnosis Date  . Diabetes mellitus type 2 in obese ([place])   . Hypertension     Objective: BP 108/76 (BP Location: Left Arm, Patient Position: Sitting, Cuff Size: Large)   Pulse 95   Temp 98.5 F (36.9 C) (Oral)   Ht 5\' 4"  (1.626 m)   Wt 226 lb (102.5 kg)   SpO2 98%   BMI 38.79 kg/m  General: Awake, appears stated age Heart: RRR, no LE edema Lungs: CTAB, no rales, wheezes or rhonchi. No accessory muscle use Psych: Age appropriate judgment and insight, normal affect and mood  Assessment and Plan: Type 2 diabetes mellitus with hyperglycemia, without long-term current use of insulin (HCC)  Status: Uncontrolled. Not interested in GLP-1. Cont Metformin 1000 mg bid, Actos 30 mg/d, Januvia 100 mg/d. Will send me readings in 2-3 weeks with sugars to see if she is improving as she does not want to start anything today. Will add SGLT-2 inhibitor. Counseled on diet/exercise. F/u in early Jan or prn.   The patient voiced understanding and agreement to the plan.  [place], DO 07/13/20  7:55 AM

## 2020-07-19 ENCOUNTER — Other Ambulatory Visit: Payer: Self-pay | Admitting: Family Medicine

## 2020-07-24 LAB — HM DIABETES EYE EXAM

## 2020-08-02 ENCOUNTER — Other Ambulatory Visit: Payer: Self-pay | Admitting: Family Medicine

## 2020-08-02 MED ORDER — CONTOUR NEXT TEST VI STRP
ORAL_STRIP | 3 refills | Status: DC
Start: 2020-08-02 — End: 2021-05-24

## 2020-08-02 MED ORDER — CONTOUR NEXT TEST VI STRP: ORAL_STRIP | 3 refills | Status: DC

## 2020-08-02 NOTE — Addendum Note (Signed)
Addended by: Jeronimo Greaves on: 08/02/2020 12:37 PM   Modules accepted: Orders

## 2020-08-13 ENCOUNTER — Other Ambulatory Visit: Payer: Self-pay | Admitting: Obstetrics & Gynecology

## 2020-08-13 DIAGNOSIS — Z1231 Encounter for screening mammogram for malignant neoplasm of breast: Secondary | ICD-10-CM

## 2020-08-17 NOTE — Telephone Encounter (Signed)
Pt scheduled for 08/21/20

## 2020-08-21 ENCOUNTER — Other Ambulatory Visit: Payer: Self-pay

## 2020-08-21 ENCOUNTER — Encounter: Payer: Self-pay | Admitting: Family Medicine

## 2020-08-21 ENCOUNTER — Ambulatory Visit: Payer: Managed Care, Other (non HMO) | Admitting: Family Medicine

## 2020-08-21 VITALS — BP 122/78 | HR 101 | Temp 99.2°F | Ht 64.0 in | Wt 227.0 lb

## 2020-08-21 DIAGNOSIS — R059 Cough, unspecified: Secondary | ICD-10-CM

## 2020-08-21 MED ORDER — ALBUTEROL SULFATE HFA 108 (90 BASE) MCG/ACT IN AERS: 2.0000 | INHALATION_SPRAY | Freq: Four times a day (QID) | RESPIRATORY_TRACT | 0 refills | Status: DC | PRN

## 2020-08-21 MED ORDER — LEVOCETIRIZINE DIHYDROCHLORIDE 5 MG PO TABS: 5.0000 mg | ORAL_TABLET | Freq: Every evening | ORAL | 2 refills | Status: DC

## 2020-08-21 NOTE — Progress Notes (Signed)
Chief Complaint  Patient presents with  . Cough    Kiara Hughes is 54 y.o. and is here for a cough.  Duration: 5 weeks  Productive? No Associated symptoms: worse when laying down/at night Denies: fever, nasal congestion, rhinorrhea, sore throat, facial pain, hemoptysis, dyspnea, wheezing, a/w meals Hx of GERD? No ACEi? No  Past Medical History:  Diagnosis Date  . Diabetes mellitus type 2 in obese (HCC)   . Hypertension    Family History  Problem Relation Age of Onset  . Diabetes Mother   . Hypertension Mother   . Diabetes Sister   . Cancer Neg Hx   . Stroke Neg Hx    Allergies as of 08/21/2020      Reactions   Penicillins Nausea And Vomiting   Sulfa Antibiotics Rash      Medication List       Accurate as of August 21, 2020 11:35 AM. If you have any questions, ask your nurse or doctor.        albuterol 108 (90 Base) MCG/ACT inhaler Commonly known as: VENTOLIN HFA Inhale 2 puffs into the lungs every 6 (six) hours as needed for wheezing or shortness of breath. Started by: Nicholas Paul Wendling, DO   amLODipine 10 MG tablet Commonly known as: NORVASC TAKE 1 TABLET BY MOUTH  DAILY   atorvastatin 40 MG tablet Commonly known as: LIPITOR TAKE 1 TABLET BY MOUTH  DAILY   B-D SINGLE USE SWABS REGULAR Pads USE DAILY TO CHECK BLOOD  SUGAR   Contour Next Monitor w/Device Kit USE AS DIRECTED DAILY   Contour Next Test test strip Generic drug: glucose blood PATIENT IS TO CHECK BLOOD SUGAR  BID . DX:E11.9   Januvia 100 MG tablet Generic drug: sitaGLIPtin TAKE 1 TABLET BY MOUTH  DAILY   levocetirizine 5 MG tablet Commonly known as: XYZAL Take 1 tablet (5 mg total) by mouth every evening. Started by: Nicholas Paul Wendling, DO   losartan 100 MG tablet Commonly known as: COZAAR TAKE 1 TABLET BY MOUTH  DAILY   metFORMIN 1000 MG tablet Commonly known as: GLUCOPHAGE Take 1 tablet (1,000 mg total) by mouth 2 (two) times daily with a meal.   Microlet Lancets  Misc USE 1 LANCET DAILY TO CHECK BLOOD SUGAR   pioglitazone 30 MG tablet Commonly known as: ACTOS TAKE 1 TABLET BY MOUTH  DAILY       BP 122/78 (BP Location: Left Arm, Patient Position: Sitting, Cuff Size: Normal)   Pulse (!) 101   Temp 99.2 F (37.3 C) (Oral)   Ht 5' 4" (1.626 m)   Wt 227 lb (103 kg)   SpO2 97%   BMI 38.96 kg/m  Gen: Awake, alert, appears stated age HEENT: Ears neg, nares patent without D/C, turbinates unremarkable, Pharynx pink without exudate Neck: Supple, no masses or asymmetry, no tenderness Heart: RRR, no LE edema Lungs: CTAB, normal effort, no accessory muscle use Abd: BS+, NT, ND Psych: Age appropriate judgement and insight, normal mood and affect  Cough - Plan: levocetirizine (XYZAL) 5 MG tablet, albuterol (VENTOLIN HFA) 108 (90 Base) MCG/ACT inhaler  Trial SABA. Some hyperemia over soft palate/tonsillar pillars suggestive of allergies, will trial Xyzal for UACS. Offered CXR, declined at this time. If no improvement, would certainly pursue.  F/u in 4 weeks if symptoms fail to improve. The patient voiced understanding and agreement to the plan.  Nicholas Paul Wendling, DO 08/21/20 11:35 AM  

## 2020-08-21 NOTE — Patient Instructions (Signed)
Claritin (loratadine), Allegra (fexofenadine), Zyrtec (cetirizine) which is also equivalent to Xyzal (levocetirizine); these are listed in order from weakest to strongest. Generic, and therefore cheaper, options are in the parentheses.   Flonase (fluticasone); nasal spray that is over the counter. 2 sprays each nostril, once daily. Aim towards the same side eye when you spray.  There are available OTC, and the generic versions, which may be cheaper, are in parentheses. Show this to a pharmacist if you have trouble finding any of these items.  The chest X-ray is a standing offer.  Use the inhaler if you need it.   Let us know if you need anything.

## 2020-09-12 ENCOUNTER — Other Ambulatory Visit: Payer: Self-pay | Admitting: Family Medicine

## 2020-09-12 DIAGNOSIS — E119 Type 2 diabetes mellitus without complications: Secondary | ICD-10-CM

## 2020-09-20 DIAGNOSIS — R059 Cough, unspecified: Secondary | ICD-10-CM

## 2020-09-28 ENCOUNTER — Other Ambulatory Visit: Payer: Self-pay

## 2020-09-28 ENCOUNTER — Ambulatory Visit (HOSPITAL_BASED_OUTPATIENT_CLINIC_OR_DEPARTMENT_OTHER)
Admission: RE | Admit: 2020-09-28 | Discharge: 2020-09-28 | Disposition: A | Discharge status: Home / Self Care | Payer: Managed Care, Other (non HMO) | Source: Ambulatory Visit | Attending: Family Medicine | Admitting: Family Medicine

## 2020-09-28 ENCOUNTER — Ambulatory Visit (INDEPENDENT_AMBULATORY_CARE_PROVIDER_SITE_OTHER): Payer: Managed Care, Other (non HMO) | Admitting: Family Medicine

## 2020-09-28 ENCOUNTER — Encounter: Payer: Self-pay | Admitting: Family Medicine

## 2020-09-28 VITALS — BP 122/82 | HR 89 | Temp 99.1°F | Ht 64.0 in | Wt 229.4 lb

## 2020-09-28 DIAGNOSIS — E1165 Type 2 diabetes mellitus with hyperglycemia: Secondary | ICD-10-CM | POA: Diagnosis not present

## 2020-09-28 DIAGNOSIS — R059 Cough, unspecified: Secondary | ICD-10-CM | POA: Diagnosis not present

## 2020-09-28 DIAGNOSIS — Z23 Encounter for immunization: Secondary | ICD-10-CM | POA: Diagnosis not present

## 2020-09-28 LAB — HEMOGLOBIN A1C: Hgb A1c MFr Bld: 6.6 % — ABNORMAL HIGH (ref 4.6–6.5)

## 2020-09-28 MED ORDER — GABAPENTIN 300 MG PO CAPS: 300.0000 mg | ORAL_CAPSULE | Freq: Three times a day (TID) | ORAL | 3 refills | Status: DC

## 2020-09-28 NOTE — Patient Instructions (Signed)
Give [place] 2-3 business days to get the results of your labs back.   Keep the diet clean and stay active.  We will be in touch regarding your X-ray.   Let us know if you need anything.

## 2020-09-28 NOTE — Progress Notes (Signed)
Subjective:   Chief Complaint  Patient presents with  . Follow-up    Kiara Hughes is a 55 y.o. female here for follow-up of diabetes.   Kiara Hughes's self monitored glucose range is 90-110's.  Patient denies hypoglycemic reactions. She checks her glucose levels 1 time(s) per day. Patient does not require insulin.   Medications include: metformin 1000 mg bid, Januvia 100 mg/d, Actos 30 mg/d Diet is healthy.  Exercise: HIIT No Cp or SOB.   Her cough continues. She trialed albuterol but it did not help and made her quite light headed. No a/w meals or position. Happens randomly. She does not feel like there is post-nasal drainage associated. No wheezing or sob.   Past Medical History:  Diagnosis Date  . Diabetes mellitus type 2 in obese ([place])   . Hypertension      Related testing: Retinal exam: Done Pneumovax: done  Objective:  BP 122/82 (BP Location: Left Arm, Patient Position: Sitting, Cuff Size: Large)   Pulse 89   Temp 99.1 F (37.3 C) (Oral)   Ht 5\' 4"  (1.626 m)   Wt 229 lb 6 oz (104 kg)   SpO2 98%   BMI 39.37 kg/m  General:  Well developed, well nourished, in no apparent distress Eyes:  Pupils equal and round, sclera anicteric without injection  Lungs:  CTAB, no access msc use Cardio:  RRR, no bruits, no LE edema Psych: Age appropriate judgment and insight  Assessment:   Type 2 diabetes mellitus with hyperglycemia, without long-term current use of insulin (HCC) - Plan: Hemoglobin A1c  Cough - Plan: DG Chest 2 View  Need for shingles vaccine - Plan: Varicella-zoster vaccine IM (Shingrix)   Plan:   1. Ck A1c. Sounds like she's controlled now. Cont Januvia 100 mg/d, Actos 30 mg/d, Metformin 1000 mg bid. Counseled on diet and exercise. 2. Ck CXR. Consider gabapentin vs ICS vs pulm referral.  F/u in 3-6 mo pending above. The patient voiced understanding and agreement to the plan.  [place], DO 09/28/20 8:44 AM

## 2020-10-09 ENCOUNTER — Ambulatory Visit: Payer: Managed Care, Other (non HMO)

## 2020-11-20 ENCOUNTER — Other Ambulatory Visit: Payer: Self-pay | Admitting: Family Medicine

## 2020-11-21 ENCOUNTER — Ambulatory Visit
Admission: RE | Admit: 2020-11-21 | Discharge: 2020-11-21 | Disposition: A | Discharge status: Home / Self Care | Payer: Managed Care, Other (non HMO) | Source: Ambulatory Visit | Attending: Obstetrics & Gynecology | Admitting: Obstetrics & Gynecology

## 2020-11-21 ENCOUNTER — Other Ambulatory Visit: Payer: Self-pay

## 2020-11-21 DIAGNOSIS — Z1231 Encounter for screening mammogram for malignant neoplasm of breast: Secondary | ICD-10-CM

## 2020-11-22 ENCOUNTER — Other Ambulatory Visit: Payer: Self-pay | Admitting: Family Medicine

## 2020-11-22 DIAGNOSIS — I10 Essential (primary) hypertension: Secondary | ICD-10-CM

## 2020-11-23 ENCOUNTER — Encounter: Payer: Self-pay | Admitting: Gastroenterology

## 2020-11-27 ENCOUNTER — Other Ambulatory Visit: Payer: Self-pay | Admitting: Obstetrics & Gynecology

## 2020-11-27 DIAGNOSIS — R928 Other abnormal and inconclusive findings on diagnostic imaging of breast: Secondary | ICD-10-CM

## 2020-12-03 ENCOUNTER — Encounter: Payer: Self-pay | Admitting: Gastroenterology

## 2020-12-12 ENCOUNTER — Ambulatory Visit
Admission: RE | Admit: 2020-12-12 | Discharge: 2020-12-12 | Disposition: A | Discharge status: Home / Self Care | Payer: Managed Care, Other (non HMO) | Source: Ambulatory Visit | Attending: Obstetrics & Gynecology | Admitting: Obstetrics & Gynecology

## 2020-12-12 ENCOUNTER — Ambulatory Visit: Payer: Managed Care, Other (non HMO)

## 2020-12-12 ENCOUNTER — Other Ambulatory Visit: Payer: Self-pay

## 2020-12-12 DIAGNOSIS — R928 Other abnormal and inconclusive findings on diagnostic imaging of breast: Secondary | ICD-10-CM

## 2020-12-20 ENCOUNTER — Other Ambulatory Visit: Payer: Self-pay | Admitting: Family Medicine

## 2020-12-20 DIAGNOSIS — I10 Essential (primary) hypertension: Secondary | ICD-10-CM

## 2020-12-21 ENCOUNTER — Ambulatory Visit: Payer: Managed Care, Other (non HMO) | Admitting: Family Medicine

## 2020-12-22 ENCOUNTER — Other Ambulatory Visit: Payer: Self-pay | Admitting: Family Medicine

## 2020-12-22 DIAGNOSIS — E119 Type 2 diabetes mellitus without complications: Secondary | ICD-10-CM

## 2021-01-18 ENCOUNTER — Encounter: Payer: Self-pay | Admitting: Family Medicine

## 2021-01-18 ENCOUNTER — Ambulatory Visit: Payer: Managed Care, Other (non HMO) | Admitting: Family Medicine

## 2021-01-18 ENCOUNTER — Other Ambulatory Visit (INDEPENDENT_AMBULATORY_CARE_PROVIDER_SITE_OTHER): Payer: Managed Care, Other (non HMO)

## 2021-01-18 ENCOUNTER — Other Ambulatory Visit: Payer: Self-pay | Admitting: Family Medicine

## 2021-01-18 ENCOUNTER — Other Ambulatory Visit: Payer: Self-pay

## 2021-01-18 VITALS — BP 118/80 | HR 93 | Temp 98.9°F | Ht 64.0 in | Wt 233.1 lb

## 2021-01-18 DIAGNOSIS — E1165 Type 2 diabetes mellitus with hyperglycemia: Secondary | ICD-10-CM | POA: Diagnosis not present

## 2021-01-18 DIAGNOSIS — D72818 Other decreased white blood cell count: Secondary | ICD-10-CM | POA: Diagnosis not present

## 2021-01-18 DIAGNOSIS — I10 Essential (primary) hypertension: Secondary | ICD-10-CM

## 2021-01-18 LAB — LIPID PANEL
Cholesterol: 126 mg/dL (ref 0–200)
HDL: 52.9 mg/dL (ref >39.00)
LDL Cholesterol: 60 mg/dL (ref 0–99)
NonHDL: 73.13
Total CHOL/HDL Ratio: 2
Triglycerides: 68 mg/dL (ref 0.0–149.0)
VLDL: 13.6 mg/dL (ref 0.0–40.0)

## 2021-01-18 LAB — COMPREHENSIVE METABOLIC PANEL
ALT: 13 U/L (ref 0–35)
AST: 12 U/L (ref 0–37)
Albumin: 4.4 g/dL (ref 3.5–5.2)
Alkaline Phosphatase: 78 U/L (ref 39–117)
BUN: 14 mg/dL (ref 6–23)
CO2: 29 mEq/L (ref 19–32)
Calcium: 9.9 mg/dL (ref 8.4–10.5)
Chloride: 102 mEq/L (ref 96–112)
Creatinine, Ser: 0.7 mg/dL (ref 0.40–1.20)
GFR: 97.96 mL/min (ref >60.00)
Glucose, Bld: 98 mg/dL (ref 70–99)
Potassium: 4.3 mEq/L (ref 3.5–5.1)
Sodium: 139 mEq/L (ref 135–145)
Total Bilirubin: 0.4 mg/dL (ref 0.2–1.2)
Total Protein: 7.7 g/dL (ref 6.0–8.3)

## 2021-01-18 LAB — CBC
HCT: 35.1 % — ABNORMAL LOW (ref 36.0–46.0)
Hemoglobin: 11.2 g/dL — ABNORMAL LOW (ref 12.0–15.0)
MCHC: 31.9 g/dL (ref 30.0–36.0)
MCV: 87.8 fl (ref 78.0–100.0)
Platelets: 361 10*3/uL (ref 150.0–400.0)
RBC: 4 Mil/uL (ref 3.87–5.11)
RDW: 15 % (ref 11.5–15.5)
WBC: 6.9 10*3/uL (ref 4.0–10.5)

## 2021-01-18 LAB — MICROALBUMIN / CREATININE URINE RATIO
Creatinine,U: 121.8 mg/dL
Microalb Creat Ratio: 0.7 mg/g (ref 0.0–30.0)
Microalb, Ur: 0.8 mg/dL (ref 0.0–1.9)

## 2021-01-18 LAB — IBC + FERRITIN
Ferritin: 66 ng/mL (ref 10.0–291.0)
Iron: 43 ug/dL (ref 42–145)
Saturation Ratios: 11.9 % — ABNORMAL LOW (ref 20.0–50.0)
Transferrin: 259 mg/dL (ref 212.0–360.0)

## 2021-01-18 LAB — HEMOGLOBIN A1C: Hgb A1c MFr Bld: 5.9 % (ref 4.6–6.5)

## 2021-01-18 NOTE — Patient Instructions (Signed)
Give us 2-3 business days to get the results of your labs back.   Keep the diet clean and stay active.  Let us know if you need anything. 

## 2021-01-18 NOTE — Progress Notes (Signed)
Subjective:   Chief Complaint  Patient presents with  . Follow-up    Kiara Hughes is a 55 y.o. female here for follow-up of diabetes.   Kiara Hughes's self monitored glucose range is 90-120's.  Patient denies hypoglycemic reactions. She checks her glucose levels 1-2 time(s) per day. Patient does not require insulin.   Medications include: Metformin 1000 mg bid, Actos 30 mg/d, Januvia 100 mg/d Diet is healthy.  Exercise: HIIT No CP or SOB.   Hypertension Patient presents for hypertension follow up. She does not monitor home blood pressures. She is compliant with medications- losartan 100 mg/d. Patient has these side effects of medication: none Diet/exercise as above.   Past Medical History:  Diagnosis Date  . Diabetes mellitus type 2 in obese ([place])   . Hypertension      Related testing: Retinal exam: Done Pneumovax: done  Objective:  BP 118/80 (BP Location: Left Arm, Patient Position: Sitting, Cuff Size: Large)   Pulse 93   Temp 98.9 F (37.2 C) (Oral)   Ht 5\' 4"  (1.626 m)   Wt 233 lb 2 oz (105.7 kg)   SpO2 98%   BMI 40.02 kg/m  General:  Well developed, well nourished, in no apparent distress Skin:  Warm, no pallor or diaphoresis Head:  Normocephalic, atraumatic Eyes:  Pupils equal and round, sclera anicteric without injection  Lungs:  CTAB, no access msc use Cardio:  RRR, no bruits, no LE edema Musculoskeletal:  Symmetrical muscle groups noted without atrophy or deformity Neuro:  Sensation intact to pinprick on feet Psych: Age appropriate judgment and insight  Assessment:   Type 2 diabetes mellitus with hyperglycemia, without long-term current use of insulin (HCC) - Plan: Comprehensive metabolic panel, CBC, Lipid panel, Hemoglobin A1c, Microalbumin / creatinine urine ratio  Essential hypertension  Morbid obesity (HCC)   Plan:   1.  Chronic, stable.  Continue metformin 1000 mg twice daily, Januvia 100 mg daily, Actos 30 mg daily.  Check above labs.   Counseled on diet and exercise. 2.  Chronic, stable.  Continue losartan 100 mg daily. She has a colonoscopy scheduled at the end of the month. F/u in 6 mo for physical or as needed. The patient voiced understanding and agreement to the plan.  [place], DO 01/18/21 9:27 AM

## 2021-01-22 ENCOUNTER — Other Ambulatory Visit: Payer: Self-pay | Admitting: Family Medicine

## 2021-01-22 DIAGNOSIS — E119 Type 2 diabetes mellitus without complications: Secondary | ICD-10-CM

## 2021-01-28 ENCOUNTER — Ambulatory Visit (AMBULATORY_SURGERY_CENTER): Payer: Managed Care, Other (non HMO)

## 2021-01-28 VITALS — Ht 64.0 in | Wt 230.0 lb

## 2021-01-28 DIAGNOSIS — Z86010 Personal history of colonic polyps: Secondary | ICD-10-CM

## 2021-01-28 MED ORDER — NA SULFATE-K SULFATE-MG SULF 17.5-3.13-1.6 GM/177ML PO SOLN: 1.0000 | Freq: Once | ORAL | 0 refills | Status: AC

## 2021-01-28 NOTE — Progress Notes (Signed)

## 2021-02-01 ENCOUNTER — Encounter: Payer: Self-pay | Admitting: Gastroenterology

## 2021-02-06 ENCOUNTER — Encounter: Payer: Self-pay | Admitting: Gastroenterology

## 2021-02-06 ENCOUNTER — Other Ambulatory Visit: Payer: Self-pay

## 2021-02-06 ENCOUNTER — Ambulatory Visit (AMBULATORY_SURGERY_CENTER): Payer: Managed Care, Other (non HMO) | Admitting: Gastroenterology

## 2021-02-06 VITALS — BP 113/67 | HR 80 | Temp 97.2°F | Resp 12 | Ht 64.0 in | Wt 230.0 lb

## 2021-02-06 DIAGNOSIS — Z86010 Personal history of colonic polyps: Secondary | ICD-10-CM | POA: Diagnosis present

## 2021-02-06 DIAGNOSIS — D124 Benign neoplasm of descending colon: Secondary | ICD-10-CM

## 2021-02-06 DIAGNOSIS — K635 Polyp of colon: Secondary | ICD-10-CM

## 2021-02-06 MED ORDER — SODIUM CHLORIDE 0.9 % IV SOLN: 500.0000 mL | INTRAVENOUS | Status: AC

## 2021-02-06 NOTE — Op Note (Signed)
[facility] Patient Name: Kiara Hughes Procedure Date: 02/06/2021 2:43 PM MRN: [id number] Endoscopist: Thornton Park MD, MD Age: 55 Referring MD:  Date of Birth: 1966/08/02 Gender: Female Account #: [id number] Procedure:                Colonoscopy Indications:              Surveillance: Personal history of adenomatous                            polyps on last colonoscopy 3 years ago                           Multiple sessile serrated polyps and a hyperplastic                            polyps on colonoscopy in [place] 12/2017 Medicines:                Monitored Anesthesia Care Procedure:                Pre-Anesthesia Assessment:                           - Prior to the procedure, a History and Physical                            was performed, and patient medications and                            allergies were reviewed. The patient's tolerance of                            previous anesthesia was also reviewed. The risks                            and benefits of the procedure and the sedation                            options and risks were discussed with the patient.                            All questions were answered, and informed consent                            was obtained. Prior Anticoagulants: The patient has                            taken no previous anticoagulant or antiplatelet                            agents. ASA Grade Assessment: II - A patient with                            mild systemic disease. After reviewing the risks  and benefits, the patient was deemed in                            satisfactory condition to undergo the procedure.                           After obtaining informed consent, the colonoscope                            was passed under direct vision. Throughout the                            procedure, the patient's blood pressure, pulse, and                            oxygen saturations were  monitored continuously. The                            Colonoscope was introduced through the anus and                            advanced to the 3 cm into the ileum. The                            colonoscopy was performed without difficulty. The                            patient tolerated the procedure well. The quality                            of the bowel preparation was good. The terminal                            ileum, ileocecal valve, appendiceal orifice, and                            rectum were photographed. Scope In: 2:50:16 PM Scope Out: 3:09:07 PM Scope Withdrawal Time: 0 hours 10 minutes 23 seconds  Total Procedure Duration: 0 hours 18 minutes 51 seconds  Findings:                 The perianal and digital rectal examinations were                            normal.                           A 4 mm polyp was found in the descending colon. The                            polyp was sessile. The polyp was removed with a                            cold snare. Resection and retrieval were complete.  Estimated blood loss was minimal.                           The exam was otherwise without abnormality on                            direct and retroflexion views. Complications:            No immediate complications. Estimated blood loss:                            Minimal. Estimated Blood Loss:     Estimated blood loss was minimal. Impression:               - One 4 mm polyp in the descending colon, removed                            with a cold snare. Resected and retrieved.                           - The examination was otherwise normal on direct                            and retroflexion views. Recommendation:           - Patient has a contact number available for                            emergencies. The signs and symptoms of potential                            delayed complications were discussed with the                            patient.  Return to normal activities tomorrow.                            Written discharge instructions were provided to the                            patient.                           - High fiber diet.                           - Continue present medications.                           - Await pathology results.                           - Repeat colonoscopy date to be determined after                            pending pathology results are reviewed for  surveillance based on pathology results.                           - Emerging evidence supports eating a diet of                            fruits, vegetables, grains, calcium, and yogurt                            while reducing red meat and alcohol may reduce the                            risk of colon cancer.                           - Thank you for allowing me to be involved in your                            colon cancer prevention. Thornton Park MD, MD 02/06/2021 3:20:12 PM This report has been signed electronically.

## 2021-02-06 NOTE — Patient Instructions (Signed)
YOU HAD AN ENDOSCOPIC PROCEDURE TODAY AT [facility]:   Refer to the procedure report that was given to you for any specific questions about what was found during the examination.  If the procedure report does not answer your questions, please call your gastroenterologist to clarify.  If you requested that your care partner not be given the details of your procedure findings, then the procedure report has been included in a sealed envelope for you to review at your convenience later.  YOU SHOULD EXPECT: Some feelings of bloating in the abdomen. Passage of more gas than usual.  Walking can help get rid of the air that was put into your GI tract during the procedure and reduce the bloating. If you had a lower endoscopy (such as a colonoscopy or flexible sigmoidoscopy) you may notice spotting of blood in your stool or on the toilet paper. If you underwent a bowel prep for your procedure, you may not have a normal bowel movement for a few days.  Please Note:  You might notice some irritation and congestion in your nose or some drainage.  This is from the oxygen used during your procedure.  There is no need for concern and it should clear up in a day or so.  SYMPTOMS TO REPORT IMMEDIATELY:   Following lower endoscopy (colonoscopy or flexible sigmoidoscopy):  Excessive amounts of blood in the stool  Significant tenderness or worsening of abdominal pains  Swelling of the abdomen that is new, acute  Fever of 100F or higher   For urgent or emergent issues, a gastroenterologist can be reached at any hour by calling [phone]. Do not use MyChart messaging for urgent concerns.    DIET:  We do recommend a small meal at first, but then you may proceed to your regular diet.  Drink plenty of fluids but you should avoid alcoholic beverages for 24 hours. Follow a High Fiber Diet (see handout given to you by your recovery nurse).  MEDICATIONS: Continue your present medications.  Please  see handouts given to you by your recovery nurse.  Thank you for allowing [place] to provide for your healthcare needs today.  ACTIVITY:  You should plan to take it easy for the rest of today and you should NOT DRIVE or use heavy machinery until tomorrow (because of the sedation medicines used during the test).    FOLLOW UP: Our staff will call the number listed on your records 48-72 hours following your procedure to check on you and address any questions or concerns that you may have regarding the information given to you following your procedure. If we do not reach you, we will leave a message.  We will attempt to reach you two times.  During this call, we will ask if you have developed any symptoms of COVID 19. If you develop any symptoms (ie: fever, flu-like symptoms, shortness of breath, cough etc.) before then, please call [phone].  If you test positive for Covid 19 in the 2 weeks post procedure, please call and report this information to [place].    If any biopsies were taken you will be contacted by phone or by letter within the next 1-3 weeks.  Please call us at [phone] if you have not heard about the biopsies in 3 weeks.    SIGNATURES/CONFIDENTIALITY: You and/or your care partner have signed paperwork which will be entered into your electronic medical record.  These signatures attest to the fact that that the information above on  your After Visit Summary has been reviewed and is understood.  Full responsibility of the confidentiality of this discharge information lies with you and/or your care-partner. 

## 2021-02-06 NOTE — Progress Notes (Signed)
Called to room to assist during endoscopic procedure.  Patient ID and intended procedure confirmed with present staff. Received instructions for my participation in the procedure from the performing physician.  

## 2021-02-06 NOTE — Progress Notes (Signed)
PT taken to PACU. Monitors in place. VSS. Report given to RN. 

## 2021-02-06 NOTE — Progress Notes (Signed)
VS by CW  I have reviewed the patient's medical history in detail and updated the computerized patient record.  

## 2021-02-07 IMAGING — DX DG CHEST 2V
2 series · 2 of 2 positions shown · non-contrast
Comparison: None.

CLINICAL DATA: Nonproductive cough for 3 months.

EXAM:
CHEST - 2 VIEW

[chest pa]
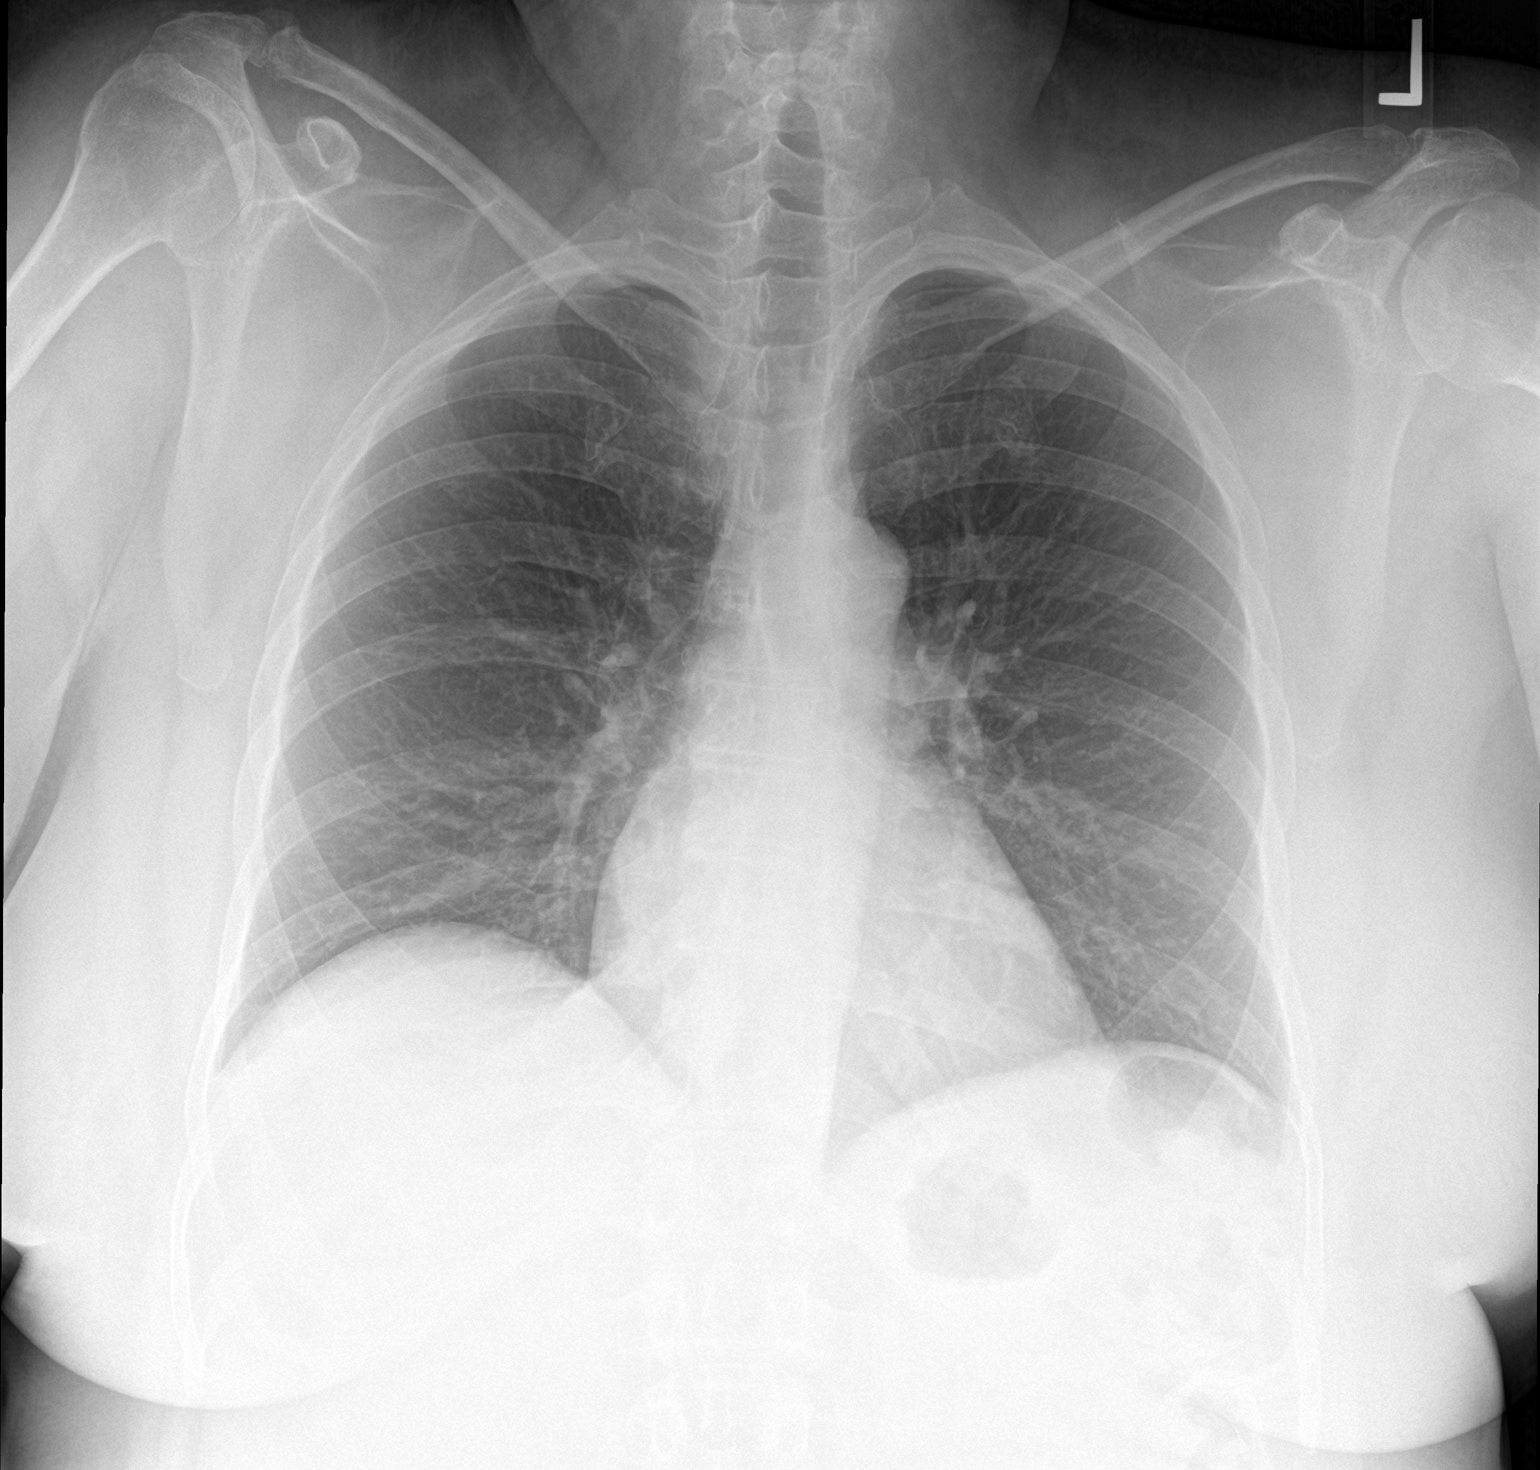

[chest lat]
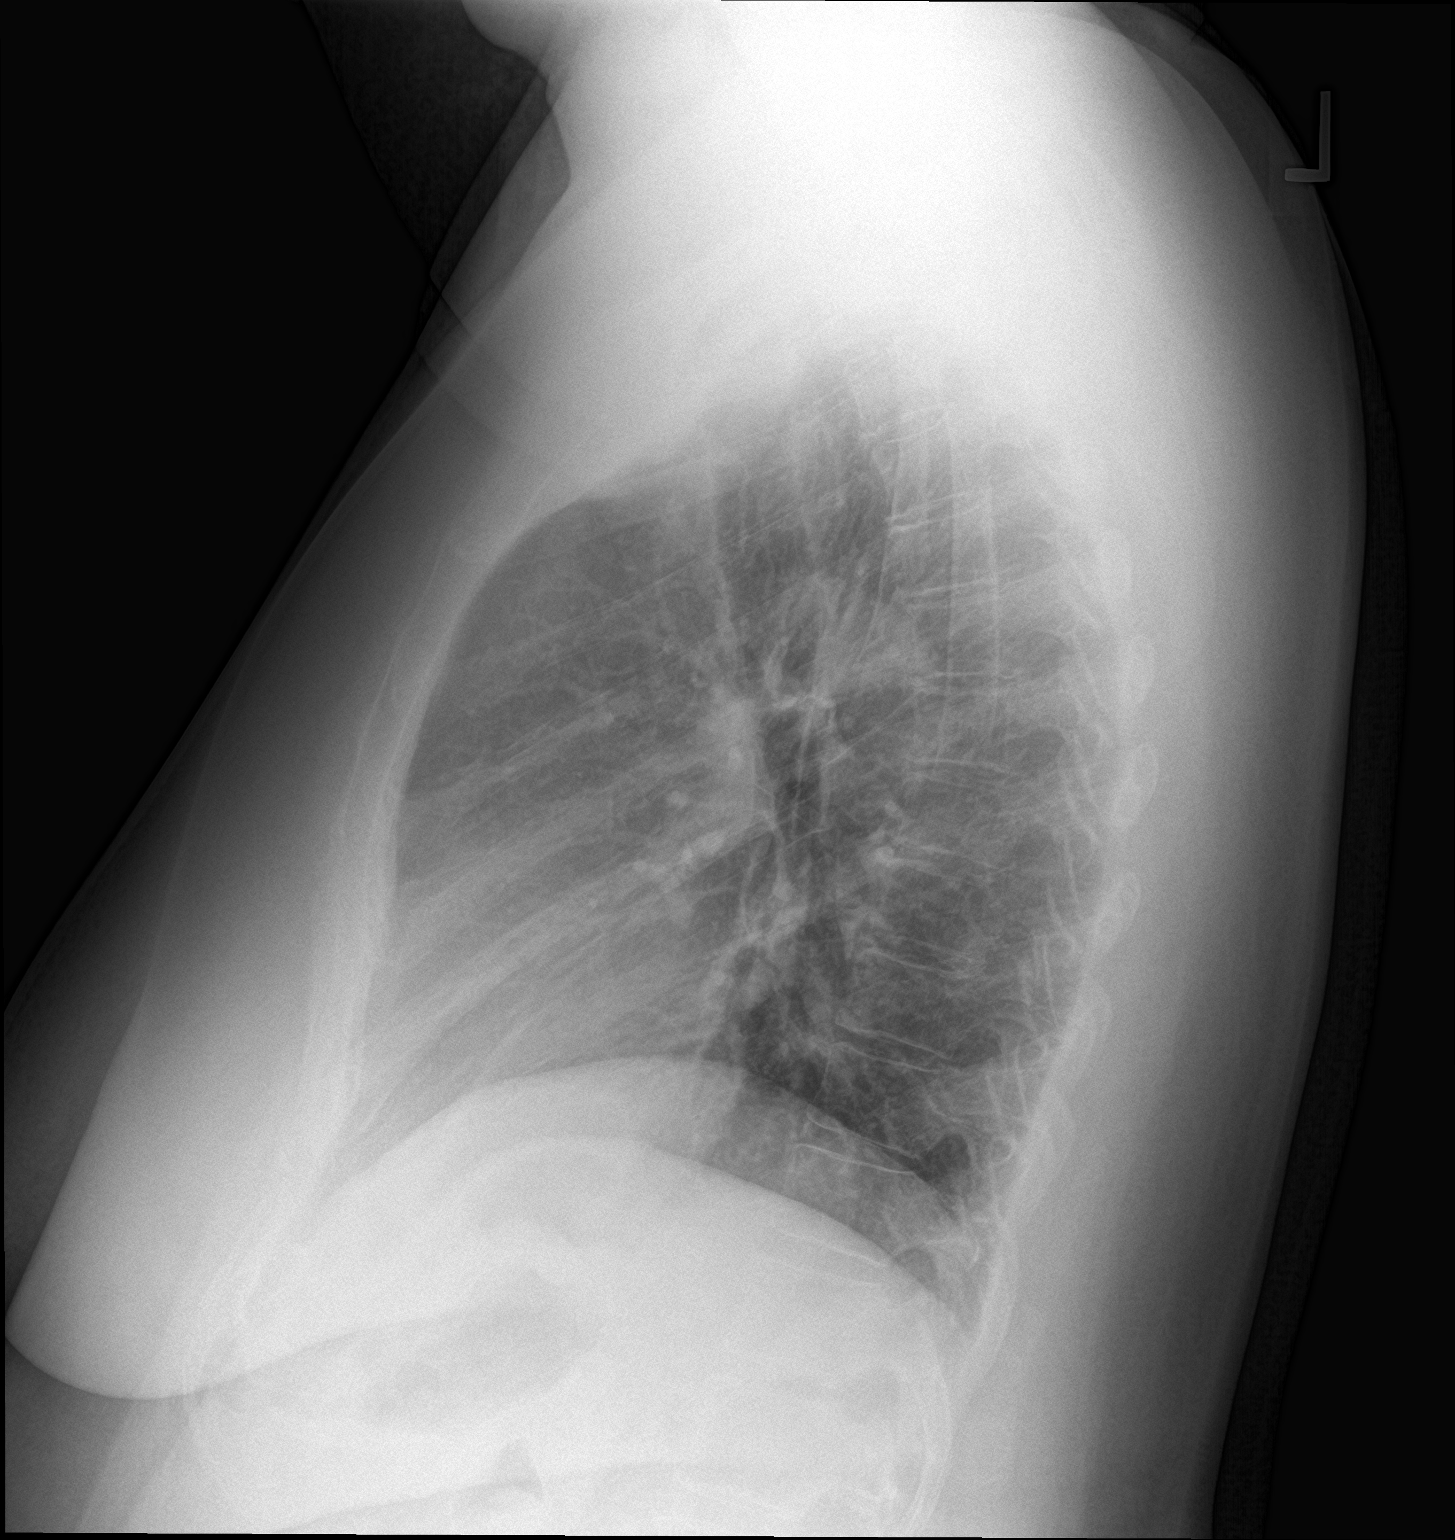

[2 of 2 positions shown; findings below may reference images not displayed]

FINDINGS: The heart size and mediastinal contours are within normal limits.
Both lungs are clear. The visualized skeletal structures are
unremarkable.
IMPRESSION: No active cardiopulmonary disease.

## 2021-02-08 ENCOUNTER — Telehealth: Payer: Self-pay

## 2021-02-08 NOTE — Telephone Encounter (Signed)
NO ANSWER, MESSAGE LEFT FOR PATIENT. 

## 2021-02-25 ENCOUNTER — Encounter: Payer: Self-pay | Admitting: Gastroenterology

## 2021-03-01 ENCOUNTER — Encounter (HOSPITAL_BASED_OUTPATIENT_CLINIC_OR_DEPARTMENT_OTHER): Payer: Self-pay | Admitting: Family Medicine

## 2021-03-29 ENCOUNTER — Ambulatory Visit: Payer: Managed Care, Other (non HMO) | Admitting: Family Medicine

## 2021-04-05 ENCOUNTER — Other Ambulatory Visit: Payer: Self-pay | Admitting: Family Medicine

## 2021-05-24 ENCOUNTER — Other Ambulatory Visit: Payer: Self-pay | Admitting: Family Medicine

## 2021-06-19 ENCOUNTER — Encounter: Payer: Managed Care, Other (non HMO) | Admitting: Family Medicine

## 2021-07-22 ENCOUNTER — Encounter: Payer: Managed Care, Other (non HMO) | Admitting: Family Medicine

## 2022-01-12 ENCOUNTER — Other Ambulatory Visit: Payer: Self-pay | Admitting: Family Medicine

## 2022-02-26 ENCOUNTER — Other Ambulatory Visit: Payer: Self-pay | Admitting: Family Medicine

## 2022-02-27 ENCOUNTER — Other Ambulatory Visit: Payer: Self-pay | Admitting: Family Medicine

## 2022-03-17 ENCOUNTER — Other Ambulatory Visit: Payer: Self-pay | Admitting: Family Medicine

## 2023-03-06 ENCOUNTER — Other Ambulatory Visit: Payer: Self-pay | Admitting: Family Medicine

## 2023-04-20 ENCOUNTER — Other Ambulatory Visit: Payer: Self-pay | Admitting: Family Medicine

## 2023-04-21 NOTE — Telephone Encounter (Signed)
Last OV--01/18/2021 Needs appointment to continue any refills Will call today to try to schedule appointment if still seeing PCP.

## 2023-04-21 NOTE — Telephone Encounter (Signed)
Called left detailed message "it's time to schedule appt with PCP to continue getting refills"

## 2023-04-22 NOTE — Telephone Encounter (Signed)
Called again left message to call back. Patient is not returning phone call Will deny this refill request at this time.

## 2023-07-14 ENCOUNTER — Other Ambulatory Visit: Payer: Self-pay | Admitting: Family Medicine

## 2024-05-23 ENCOUNTER — Other Ambulatory Visit: Payer: Self-pay | Admitting: Family Medicine
